# Patient Record
Sex: Female | Born: 1970
Health system: Southern US, Community
[De-identification: ages and names within clinical notes are randomized; demographics above are authoritative.]

## PROBLEM LIST (undated history)

## (undated) DIAGNOSIS — N920 Excessive and frequent menstruation with regular cycle: Secondary | ICD-10-CM

## (undated) DIAGNOSIS — K219 Gastro-esophageal reflux disease without esophagitis: Secondary | ICD-10-CM

## (undated) DIAGNOSIS — I471 Supraventricular tachycardia, unspecified: Secondary | ICD-10-CM

## (undated) DIAGNOSIS — D509 Iron deficiency anemia, unspecified: Secondary | ICD-10-CM

## (undated) DIAGNOSIS — Z973 Presence of spectacles and contact lenses: Secondary | ICD-10-CM

## (undated) DIAGNOSIS — D259 Leiomyoma of uterus, unspecified: Secondary | ICD-10-CM

## (undated) DIAGNOSIS — N946 Dysmenorrhea, unspecified: Secondary | ICD-10-CM

## (undated) DIAGNOSIS — I82409 Acute embolism and thrombosis of unspecified deep veins of unspecified lower extremity: Secondary | ICD-10-CM

## (undated) DIAGNOSIS — G4700 Insomnia, unspecified: Secondary | ICD-10-CM

## (undated) DIAGNOSIS — N6019 Diffuse cystic mastopathy of unspecified breast: Secondary | ICD-10-CM

## (undated) DIAGNOSIS — N8003 Adenomyosis of the uterus: Secondary | ICD-10-CM

## (undated) DIAGNOSIS — E039 Hypothyroidism, unspecified: Secondary | ICD-10-CM

## (undated) DIAGNOSIS — I10 Essential (primary) hypertension: Secondary | ICD-10-CM

## (undated) DIAGNOSIS — K8020 Calculus of gallbladder without cholecystitis without obstruction: Secondary | ICD-10-CM

## (undated) DIAGNOSIS — G4733 Obstructive sleep apnea (adult) (pediatric): Secondary | ICD-10-CM

## (undated) HISTORY — DX: Essential (primary) hypertension: I10

## (undated) HISTORY — PX: DILATION AND CURETTAGE OF UTERUS: SHX78

## (undated) HISTORY — DX: Obstructive sleep apnea (adult) (pediatric): G47.33

## (undated) HISTORY — DX: Hypothyroidism, unspecified: E03.9

## (undated) HISTORY — DX: Acute embolism and thrombosis of unspecified deep veins of unspecified lower extremity: I82.409

---
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
## 1997-06-08 ENCOUNTER — Other Ambulatory Visit: Admission: RE | Admit: 1997-06-08 | Discharge: 1997-06-08 | Payer: Self-pay | Admitting: Obstetrics & Gynecology

## 1997-06-23 ENCOUNTER — Ambulatory Visit (HOSPITAL_COMMUNITY): Admission: RE | Admit: 1997-06-23 | Discharge: 1997-06-23 | Payer: Self-pay | Admitting: *Deleted

## 1998-03-21 ENCOUNTER — Ambulatory Visit (HOSPITAL_COMMUNITY): Admission: RE | Admit: 1998-03-21 | Discharge: 1998-03-21 | Payer: Self-pay | Admitting: *Deleted

## 1998-03-23 ENCOUNTER — Ambulatory Visit (HOSPITAL_COMMUNITY): Admission: RE | Admit: 1998-03-23 | Discharge: 1998-03-23 | Payer: Self-pay | Admitting: Obstetrics and Gynecology

## 1998-04-19 ENCOUNTER — Ambulatory Visit (HOSPITAL_COMMUNITY): Admission: AD | Admit: 1998-04-19 | Discharge: 1998-04-19 | Payer: Self-pay | Admitting: Obstetrics & Gynecology

## 1998-04-25 ENCOUNTER — Other Ambulatory Visit: Admission: RE | Admit: 1998-04-25 | Discharge: 1998-04-25 | Payer: Self-pay | Admitting: Obstetrics & Gynecology

## 1998-07-17 ENCOUNTER — Other Ambulatory Visit: Admission: RE | Admit: 1998-07-17 | Discharge: 1998-07-17 | Payer: Self-pay | Admitting: Obstetrics & Gynecology

## 1999-02-12 ENCOUNTER — Observation Stay (HOSPITAL_COMMUNITY): Admission: AD | Admit: 1999-02-12 | Discharge: 1999-02-13 | Payer: Self-pay | Admitting: Obstetrics & Gynecology

## 1999-03-12 ENCOUNTER — Inpatient Hospital Stay (HOSPITAL_COMMUNITY): Admission: AD | Admit: 1999-03-12 | Discharge: 1999-03-12 | Payer: Self-pay | Admitting: Obstetrics & Gynecology

## 1999-03-29 ENCOUNTER — Inpatient Hospital Stay (HOSPITAL_COMMUNITY): Admission: AD | Admit: 1999-03-29 | Discharge: 1999-04-02 | Payer: Self-pay | Admitting: Obstetrics & Gynecology

## 1999-05-07 ENCOUNTER — Other Ambulatory Visit: Admission: RE | Admit: 1999-05-07 | Discharge: 1999-05-07 | Payer: Self-pay | Admitting: Obstetrics & Gynecology

## 2000-07-07 ENCOUNTER — Other Ambulatory Visit: Admission: RE | Admit: 2000-07-07 | Discharge: 2000-07-07 | Payer: Self-pay | Admitting: Obstetrics & Gynecology

## 2001-01-17 ENCOUNTER — Inpatient Hospital Stay (HOSPITAL_COMMUNITY): Admission: AD | Admit: 2001-01-17 | Discharge: 2001-01-17 | Payer: Self-pay | Admitting: Obstetrics & Gynecology

## 2001-01-18 ENCOUNTER — Inpatient Hospital Stay (HOSPITAL_COMMUNITY): Admission: AD | Admit: 2001-01-18 | Discharge: 2001-01-21 | Payer: Self-pay | Admitting: Obstetrics & Gynecology

## 2001-01-29 ENCOUNTER — Encounter: Admission: RE | Admit: 2001-01-29 | Discharge: 2001-02-28 | Payer: Self-pay | Admitting: Obstetrics & Gynecology

## 2001-02-18 ENCOUNTER — Other Ambulatory Visit: Admission: RE | Admit: 2001-02-18 | Discharge: 2001-02-18 | Payer: Self-pay | Admitting: Obstetrics & Gynecology

## 2002-03-15 ENCOUNTER — Other Ambulatory Visit: Admission: RE | Admit: 2002-03-15 | Discharge: 2002-03-15 | Payer: Self-pay | Admitting: Obstetrics & Gynecology

## 2002-12-06 ENCOUNTER — Other Ambulatory Visit: Admission: RE | Admit: 2002-12-06 | Discharge: 2002-12-06 | Payer: Self-pay | Admitting: Obstetrics & Gynecology

## 2002-12-15 ENCOUNTER — Inpatient Hospital Stay (HOSPITAL_COMMUNITY): Admission: AD | Admit: 2002-12-15 | Discharge: 2002-12-15 | Payer: Self-pay | Admitting: Obstetrics and Gynecology

## 2002-12-15 ENCOUNTER — Encounter: Payer: Self-pay | Admitting: Obstetrics and Gynecology

## 2003-01-17 ENCOUNTER — Inpatient Hospital Stay (HOSPITAL_COMMUNITY): Admission: AD | Admit: 2003-01-17 | Discharge: 2003-01-17 | Payer: Self-pay | Admitting: Obstetrics & Gynecology

## 2003-07-13 ENCOUNTER — Inpatient Hospital Stay (HOSPITAL_COMMUNITY): Admission: RE | Admit: 2003-07-13 | Discharge: 2003-07-16 | Payer: Self-pay | Admitting: Obstetrics & Gynecology

## 2003-07-13 ENCOUNTER — Encounter (INDEPENDENT_AMBULATORY_CARE_PROVIDER_SITE_OTHER): Payer: Self-pay | Admitting: *Deleted

## 2003-07-19 ENCOUNTER — Encounter: Admission: RE | Admit: 2003-07-19 | Discharge: 2003-08-18 | Payer: Self-pay | Admitting: Obstetrics & Gynecology

## 2003-08-24 ENCOUNTER — Other Ambulatory Visit: Admission: RE | Admit: 2003-08-24 | Discharge: 2003-08-24 | Payer: Self-pay | Admitting: Obstetrics & Gynecology

## 2004-10-18 ENCOUNTER — Other Ambulatory Visit: Admission: RE | Admit: 2004-10-18 | Discharge: 2004-10-18 | Payer: Self-pay | Admitting: Obstetrics & Gynecology

## 2006-12-22 ENCOUNTER — Encounter: Admission: RE | Admit: 2006-12-22 | Discharge: 2006-12-22 | Payer: Self-pay | Admitting: Obstetrics & Gynecology

## 2009-02-16 ENCOUNTER — Encounter: Admission: RE | Admit: 2009-02-16 | Discharge: 2009-02-16 | Payer: Self-pay | Admitting: Obstetrics & Gynecology

## 2010-02-26 ENCOUNTER — Inpatient Hospital Stay (HOSPITAL_COMMUNITY)
Admission: AD | Admit: 2010-02-26 | Discharge: 2010-02-26 | Payer: Self-pay | Source: Home / Self Care | Attending: Obstetrics and Gynecology | Admitting: Obstetrics and Gynecology

## 2010-05-21 LAB — URINALYSIS, ROUTINE W REFLEX MICROSCOPIC
Bilirubin Urine: NEGATIVE
Glucose, UA: NEGATIVE mg/dL
Ketones, ur: 15 mg/dL — AB
pH: 5.5 (ref 5.0–8.0)

## 2010-05-21 LAB — DIFFERENTIAL
Basophils Absolute: 0 10*3/uL (ref 0.0–0.1)
Eosinophils Relative: 0 % (ref 0–5)
Lymphocytes Relative: 15 % (ref 12–46)
Monocytes Absolute: 0.8 10*3/uL (ref 0.1–1.0)
Monocytes Relative: 7 % (ref 3–12)
Neutro Abs: 9.4 10*3/uL — ABNORMAL HIGH (ref 1.7–7.7)

## 2010-05-21 LAB — COMPREHENSIVE METABOLIC PANEL
ALT: 9 U/L (ref 0–35)
AST: 16 U/L (ref 0–37)
Albumin: 3.9 g/dL (ref 3.5–5.2)
CO2: 27 mEq/L (ref 19–32)
Calcium: 9.3 mg/dL (ref 8.4–10.5)
Chloride: 100 mEq/L (ref 96–112)
GFR calc non Af Amer: >60 mL/min (ref >60)
Sodium: 136 mEq/L (ref 135–145)
Total Bilirubin: 1.8 mg/dL — ABNORMAL HIGH (ref 0.3–1.2)

## 2010-05-21 LAB — CBC
Platelets: 192 10*3/uL (ref 150–400)
RBC: 4.32 MIL/uL (ref 3.87–5.11)
RDW: 12.8 % (ref 11.5–15.5)
WBC: 12.1 10*3/uL — ABNORMAL HIGH (ref 4.0–10.5)

## 2010-05-21 LAB — URINE MICROSCOPIC-ADD ON

## 2010-07-27 NOTE — Op Note (Signed)
[facility]  Patient:    Katie Coffey, Katie Coffey Visit Number: [id number] MRN: [id number]          Service Type: OBS Location: [facility] Attending Physician:  Minette Headland Dictated by:   Freddy Finner, M.D. Proc. Date: 01/18/01 Admit Date:  01/17/2001 Discharge Date: 01/17/2001                             Operative Report  PREOPERATIVE DIAGNOSIS:       Intrauterine pregnancy at [redacted] weeks gestation, scheduled repeat cesarean section on January 22, 2001, with early contractions.  POSTOPERATIVE DIAGNOSIS:      Intrauterine pregnancy at [redacted] weeks gestation, scheduled repeat cesarean section on January 22, 2001, with early contractions.  OPERATION/PROCEDURE:          Repeat low transverse cesarean section.  SURGEON:                      Freddy Finner, M.D.  ESTIMATED BLOOD LOSS:         Intraoperative estimated blood loss 600-800 cc.  INTRAOPERATIVE COMPLICATIONS: None, with delivery of viable female infant with Apgars of 9 and 9, cord pH 7.31.  INDICATIONS FOR PROCEDURE:    Details of the present illness are recorded in the admission note.  The patient presented in labor with lower abdominal pain and was taken to the operating room.  DESCRIPTION OF PROCEDURE:     She was placed under adequate spinal anesthesia and placed in the dorsal recumbent position with elevation of the right hip by 15 degrees.  The abdomen was prepped and draped in the usual fashion.  A Foley catheter was placed using sterile technique.  Sterile drapes were applied.  A lower abdominal transverse incision was made and old scar excised.  The incision was carried sharply down to fascia, which was entered sharply and extended to the extent of the skin incision.  The rectus sheath was developed superiorly and inferiorly with blunt and sharp dissection.  Rectus muscles were divided in the midline and the peritoneum entered sharply.  A bladder blade was placed and transverse incision  was made in the visceral peritoneum overlying the lower uterine segment.  The lower segment was entered sharply and extended in a transverse direction bluntly.  Fluid was clear.  Vacuum extractor was used to easily deliver a viable female, with Apgars of 9 as above. Blood was obtained for arterial gases and for routine.  The placenta was removed and this was confirmed manually.  The uterine incision was closed in a single layer with running locking 0 Monocryl.  Tubes and ovaries were inspected and found to be normal.  The uterus was normal.  With all packs, needles, and instruments accounted for the abdominal incision was closed in layers.  Running 0 Monocryl was used to close the peritoneum and reapproximate the rectus muscles.  Fascia was closed with running 0 PDS.  The skin was closed with broad skin staples and supported with Steri-Strips.  The patient tolerated the procedure well and was taken to the recovery room in good condition. Dictated by:   Freddy Finner, M.D. Attending Physician:  Minette Headland DD:  01/19/01 TD:  01/19/01 Job: [id number]

## 2010-07-27 NOTE — H&P (Signed)
NAMEJayci, Ellefson Ralyn Q                            ACCOUNT NO.:  [id number]   MEDICAL RECORD NO.:  [id number]                   PATIENT TYPE:  INP   LOCATION:  NA                                   FACILITY:  [facility]   PHYSICIAN:  Freddy Finner, M.D.                DATE OF BIRTH:  June 03, 1970   DATE OF ADMISSION:  DATE OF DISCHARGE:                                HISTORY & PHYSICAL   ANTICIPATED DATE OF ADMISSION:  Jul 14, 2003.   ADMITTING DIAGNOSES:  1. Intrauterine pregnancy at term.  2. Multiparity.  3. Request for surgical sterilization.  4. Request for repeat cesarean delivery.   HISTORY OF PRESENT ILLNESS:  The patient is a 40 year old white married  female, gravida 6, para 2, who is now admitted at term with her third viable  pregnancy.  Her prenatal course has been uncomplicated.  She has requested  definitive surgical sterilization concurrently with her delivery.  She has  refused vaginal birth because of two previous cesarean deliveries.   REVIEW OF SYSTEMS:  The patient's current review of systems is negative.   PRENATAL COURSE:  The patient's prenatal course has been uncomplicated.   PAST MEDICAL HISTORY:  The past medical history is recorded in detail in the  prenatal summary and will not be repeated at this time.   FAMILY HISTORY:  Family history is similarly so as in past medical history.   PHYSICAL EXAMINATION:  HEENT:  The HEENT in grossly within normal limits.  VITAL SIGNS: Blood pressure in the office was 102/66.  NECK:  Thyroid gland is not palpably enlarged.  CHEST:  Chest is clear to auscultation.  HEART:  Normal sinus rhythm without murmurs, rubs or gallops.  ABDOMEN:  Abdomen is gravid.  Estimated fetal weight is 7.5 pounds.  Estimated gestational age is 61 weeks.  EXTREMITIES:  Plus one edema without cyanosis or clubbing.  PELVIC:  Pelvic exam is deferred.   ASSESSMENT:  1. Intrauterine pregnancy, [redacted] weeks gestation.  2. Multiparity.  3. Request for  surgical delivery and surgical sterilization.                                              Freddy Finner, M.D.   WRN/MEDQ  D:  07/12/2003  T:  07/12/2003  Job:  [id number]

## 2010-07-27 NOTE — Discharge Summary (Signed)
[facility]  PatientCHIZUKO Coffey                            MRN: [id number] Adm. Date:  [id number] Disc. Date: [id number] Attending:  Minette Headland Dictator:   Danie Chandler, R.N.                           Discharge Summary  ADMISSION DIAGNOSES:          1. Intrauterine pregnancy at term.                               2. Breech presentation.  DISCHARGE DIAGNOSES:          1. Intrauterine pregnancy at term.                               2. Breech presentation.  PROCEDURES:                   March 30, 1999, primary low transverse cervical  cesarean section.  HISTORY OF PRESENT ILLNESS:   The patient is a 40 year old gravida 3, para 0, who was admitted for two-stage induction on March 29, 1999.  The patient received  Cytotec during the night, and was started on Pitocin.  She had spontaneous rupture of membranes approximately 7:30 a.m. on March 30, 1999.  On examination it was felt that she was dilated to approximately 3 cm and completely effaced, but the  baby was thought to be in a breech presentation.  Ultrasound at the bedside confirmed breech presentation.  HOSPITAL COURSE:              The patient is taken to the operating room and undergoes the above-named procedure without complications.  This is productive f a viable female infant with Apgars of 8 at one minute and 9 at five minutes. Postoperatively, the patient does well.  On postoperative day #1, the patients hemoglobin was 8.1, hematocrit 25.3, and white blood cell count 7.2.  On postoperative day #2, the patient was without complaint.  Her hemoglobin was stable at 8.5.  She had a good return of bowel function and was tolerating a regular diet.  She was also ambulating well, without difficulty, and had good pain control. he patient was discharged home on postoperative day 3.  CONDITION ON DISCHARGE:       Good.  DIET:                         Regular as  tolerated.  ACTIVITY:                     No heavy lifting, no driving, no vaginal entry.  FOLLOW-UP:                    She is to follow up in the office in one to two weeks for incision check.  DISCHARGE INSTRUCTIONS:       She is to call for temperature greater than 100 degrees, persistent nausea or vomiting, heavy vaginal bleeding, and/or redness r drainage from the incision site.  DISCHARGE MEDICATIONS:        1. Prenatal vitamin 1 p.o. q.d.  2. Niferex 150 mg 1 p.o. q.d. #30.                               3. Tylox #20 as directed by M.D. DD:  04/02/99 TD:  04/04/99 Job: [id number]

## 2010-07-27 NOTE — Op Note (Signed)
Katie, [name]                            ACCOUNT NO.:  [id number]   MEDICAL RECORD NO.:  [id number]                   PATIENT TYPE:  INP   LOCATION:  9134                                 FACILITY:  [facility]   PHYSICIAN:  Freddy Finner, M.D.                DATE OF BIRTH:  1970-10-01   DATE OF PROCEDURE:  07/13/2003  DATE OF DISCHARGE:                                 OPERATIVE REPORT   PREOPERATIVE DIAGNOSES:  1. Intrauterine pregnancy at term.  2. Surgically scarred uterus by two previous cesarean deliveries.  3. Multiparity and request for surgical sterilization.   POSTOPERATIVE DIAGNOSES:  1. Intrauterine pregnancy at term.  2. Surgically scarred uterus by two previous cesarean deliveries.  3. Multiparity and request for surgical sterilization.   OPERATIVE PROCEDURES:  1. Repeat low transverse cervical cesarean section.  2. Delivery of viable female infant.  3. Bilateral tubal ligation.   INFANT STATISTICS:  Weight 6 pounds 3 ounces.  The cord pH on an arterial  sample was 7.31.  Apgars of 9 and 9.   SURGEON:  Freddy Finner, M.D.   ASSISTANT:  Guy Sandifer. Coffey Katie, M.D.   ESTIMATED INTRAOPERATIVE BLOOD LOSS:  800 mL.   ANESTHESIA:  Spinal.   DESCRIPTION OF PROCEDURE:  Details of the present illness are recorded in  the admission note.  The patient was admitted on the morning of surgery,  brought to the operating room, and there placed under adequate spinal  anesthesia and placed in the dorsal recumbent position with elevation of the  right hip by 15 degrees.  The abdomen was prepped and draped in the usual  fashion.  A Foley catheter was inserted using sterile technique.  Sterile  drapes were applied.  The lower abdominal transverse incision was made  through an old scar and carried sharply down to fascia, which was entered  sharply and extended to the extent of the skin incision.  The rectus sheath  was developed superiorly and inferiorly with blunt and sharp  dissection.  The rectus muscles were divided in the midline.  The peritoneum was entered  and processed.  The peritoneal incision was extended bluntly to the extent  of the skin incision.  The bladder blade was placed.  The uterus was very  thin just above the bladder reflection and for that reason, bladder flap was  not developed and an incision was made directly through the lower uterine  segment.  The thickness of tissue at this level was approximately 2-3 mm.  Amniotic fluid was clear.  The incision was extended bluntly in a transverse  direction.  A viable female was then delivered without significant  difficulty.  There was a single length of nuchal cord that was reduced.  Apgars, etc., were noted above.  The placenta and other products of  conception were removed from the uterus after collection of cord blood  samples.  The uterus was delivered through the abdominal incision.  The  tubes and ovaries were normal as was the uterus.  The uterine incision was  closed with a running locking 0 Monocryl suture.  This produced complete  hemostasis.  The mid portion of each tube was elevated, doubly ligated with  0 plain ties, a segment of tube excised, and the distal mucosa ends of the  tubes were fulgurated with the Bovie.  The uterus was placed back within the  abdominal cavity.  Irrigation was carried out.  Hemostasis was complete.  The abdominal incision was then closed in layers.  Running 0 Monocryl was  used to close the peritoneum and reapproximate the rectus muscles.  The  fascia was closed with running 0 PDS.  The subcutaneous was closed with  running 2-0 plain suture.  The skin was closed with wide skin staples and  1/4 inch Steri-Strips.  The patient tolerated the operative procedure well  and was taken to recovery in good condition.                                               Freddy Finner, M.D.    WRN/MEDQ  D:  07/14/2003  T:  07/14/2003  Job:  [id number]

## 2010-07-27 NOTE — Discharge Summary (Signed)
[facility]  Patient:    Katie Coffey, Katie Coffey Visit Number: [id number] MRN: [id number]          Service Type: OBS Location: *N Attending Physician:  Minette Headland Dictated by:   Danie Chandler, R.N. Adm. Date:  01/18/01 Disc. Date: 01/21/01                             Discharge Summary  ADMISSION DIAGNOSES:          1. Intrauterine pregnancy at [redacted] weeks gestation                                  with scheduled repeat cesarean section on                                  January 22, 2001.                               2. Early contractions.  DISCHARGE DIAGNOSES:          1. Intrauterine pregnancy at [redacted] weeks gestation                                  with scheduled repeat cesarean section on                                  January 22, 2001.                               2. Early contractions.  PROCEDURE:                    On January 18, 2001, repeat low transverse cesarean section.  REASON FOR ADMISSION:         Please see H&P.  HOSPITAL COURSE:              The patient was taken to the operating room and underwent the above named procedure without complications.  This was productive of a viable female infant with Apgars of 9 at one minute and 9 at five minutes and arterial cord pH of 7.31.  Postoperatively, on day #1, the patient had a good return of bowel function and good pain control.  Her hemoglobin was 8.8, hematocrit 26.4, and white blood cell count 7.2.  She had a repeat CBC ordered for the following day.  On postoperative day #2, the patient was tolerating a regular diet and ambulating well without difficulty. Her hemoglobin was 8.4 and she was started on iron twice a day.  On postoperative day #3, she desired discharge home.  She was in stable condition with no complaints.  CONDITION ON DISCHARGE:       Good.  DIET:                         Regular as tolerated.  ACTIVITY:                     No heavy lifting, no driving, no  vaginal  entry.  FOLLOW-UP:                    She is to follow up in the office in one to two weeks for incision check and she is to call for temperature greater than 100 degrees, persistent nausea and vomiting, heavy vaginal bleeding, and/or redness or drainage from the incision site.  DISCHARGE MEDICATIONS:        1. Prenatal vitamins one p.o. q.d.                               2. Pain medicines as directed by M.D. Dictated by:   Danie Chandler, R.N. Attending Physician:  Minette Headland DD:  01/21/01 TD:  01/21/01 Job: [id number]

## 2010-07-27 NOTE — Discharge Summary (Signed)
NAMEIly, Katie Coffey                            ACCOUNT NO.:  [id number]   MEDICAL RECORD NO.:  [id number]                   PATIENT TYPE:  INP   LOCATION:  9134                                 FACILITY:  [facility]   PHYSICIAN:  Juluis Mire, M.D.                DATE OF BIRTH:  Sep 24, 1970   DATE OF ADMISSION:  07/13/2003  DATE OF DISCHARGE:  07/16/2003                                 DISCHARGE SUMMARY   ADMITTING DIAGNOSES:  1. Intrauterine pregnancy at term.  2. Surgically-scarred uterus, desires repeat.  3. Multiparity, desires permanent sterilization.   DISCHARGE DIAGNOSES:  1. Status post low transverse cesarean section.  2. Viable female infant.  3. Permanent sterilization.   PROCEDURE:  1. Repeat low transverse cesarean section.  2. Bilateral tubal ligation.   REASON FOR ADMISSION:  Please see dictated H&P.   HOSPITAL COURSE:  The patient is a 40 year old white married female gravida  6 para 2 that presented to [facility] for a scheduled  cesarean section.  The patient had requested a repeat cesarean due to  previous history of cesarean deliveries and she refused a vaginal birth  after cesarean.  The patient also had requested definitive surgical  sterilization concurrently with her delivery.  On the morning of admission  the patient was taken to the operating room where spinal anesthesia was  administered without difficulty.  A low transverse incision was made with  the delivery of a viable female infant weighing 6 pounds 3 ounces, Apgars of  9 at one minute and 9 at five minutes.  Umbilical cord pH was 7.31.  The  patient also underwent a bilateral tubal ligation without difficulty.  She  tolerated the procedure well and was taken to the recovery room in stable  condition.  On postoperative day #1 vital signs were stable; she was  afebrile.  Abdomen was soft with good return of bowel function.  Fundus was  firm and nontender.  Abdominal dressing was noted  to be clean, dry, and  intact.  Labs revealed hemoglobin of 8.7.  On postoperative day #2 the  patient was without complaint.  Vital signs were stable.  Fundus was firm  and nontender.  Abdominal dressing had removed revealing an incision that  was clean, dry, and intact.  The patient was ambulating well and tolerating  a regular diet without complaints of nausea and vomiting.  On postoperative  day #3 vital signs were stable.  Abdomen was soft, fundus was firm, incision  was clean, dry, and intact.  Staples were removed and the patient was  discharged home.   CONDITION ON DISCHARGE:  Good.   DIET:  Regular as tolerated.   ACTIVITY:  No heavy lifting, no driving x2 weeks, no vaginal entry.   FOLLOW-UP:  The patient is to follow up in the office in 1-2 weeks for an  incision check.  She  is to call for temperature greater than 100 degrees,  persistent nausea and vomiting, heavy vaginal bleeding, and/or redness or  drainage from the incisional site.   DISCHARGE MEDICATIONS:  1. Tylox #30 one Coffey.4-6h. p.r.n.  2. Motrin 600 mg Coffey.6h.  3. Prenatal vitamins one p.o. daily.  4. Colace one p.o. daily p.r.n.  5. Synthroid 0.88 mg one p.o. daily.     Julio Sicks, N.P.                        Juluis Mire, M.D.    CC/MEDQ  D:  08/24/2003  T:  08/25/2003  Job:  [id number]

## 2010-07-27 NOTE — Op Note (Signed)
[facility]  PatientESTEFANIE Coffey                            MRN: [id number] Proc. Date: 03/30/99 Adm. Date:  [id number] Attending:  Minette Headland                           Operative Report  PREOPERATIVE DIAGNOSES:       Intrauterine pregnancy at term; breech presentation.  POSTOPERATIVE DIAGNOSES:      Intrauterine pregnancy at term; breech presentation; delivery of viable female infant, Apgars of 8/9, birth weight 7 pounds 14 ounces.  OPERATIVE PROCEDURE:          Primary low transverse cervical cesarean section.  SURGEON:                      Freddy Finner, M.D.  ASSISTANT:                    Scrub nurse.  ANESTHESIA:                   Spinal.  INTRAOPERATIVE COMPLICATIONS:  None.  INDICATIONS:                  Patient is a 40 year old gravida 3, para 0 who was admitted for a two-stage induction on the evening of the 18th.  She was Cytoteced during the night and started on intravenous Pitocin.  Spontaneous rupture of membranes apparently occurred approximately 7:30 a.m.  On examination, initially at the time of my initial visit this morning, it was felt that she was dilated to approximately 3 cm and completely effaced but what was thought to be a breech was palpable.  Ultrasound at the bedside confirmed breech presentation.  DESCRIPTION OF PROCEDURE:     Patient was brought to the operating room, placed  under spinal anesthesia, placed in the dorsal recumbent position with elevation of the right hip by 15 degrees.  Lower abdomen was prepped and draped in usual fashion.  Foley catheter was placed using sterile technique.  A lower abdominal  transverse incision was made and carried sharply down to fascia, which was entered sharply, and extended to the extent of the skin incision.  Rectus sheath was developed superiorly and inferiorly with blunt and sharp dissection.  Rectus muscles were divided in the midline and peritoneum was  entered sharply and extended bluntly to the extent of the skin incision.  Transverse incision was made in the visceroperitoneum overlying the lower uterine segment and bladder bluntly dissected off the lower segment.  A transverse incision was made in the lower uterine segment and extended bluntly in a transverse direction.  Breech extraction delivery was  accomplished without difficulty of a viable female, Apgars of 8/9.  Placenta and other parts of conception were removed from the uterus and this was confirmed complete by manual exploration and removal of residual fragments of placenta. Edges of the uterine incision were grasped with ring forceps.  Uterine incision was closed in a single layer with running-locking 0 Monocryl.  Bladder flap was reapproximated with running 0 Monocryl.  Tubes and ovaries were inspected and found to be normal.  Uterus was palpably normal.  Irrigation was carried out. Hemostasis was complete.  Appropriate pack, needle and instrument counts were obtained. Abdominal incision as then closed in layers with running 0 Monocryl  to close the peritoneum and reapproximate the rectus muscles, fascia was closed with running 0 PDS and skin was closed with wide skin staples and quarter-inch Steri-Strips. Patient tolerated the procedure well and was taken to recovery in good condition. DD:  03/30/99 TD:  03/30/99 Job: [id number]

## 2011-06-09 ENCOUNTER — Ambulatory Visit (INDEPENDENT_AMBULATORY_CARE_PROVIDER_SITE_OTHER): Payer: Self-pay | Admitting: Physician Assistant

## 2011-06-09 VITALS — BP 111/77 | HR 87 | Temp 98.5°F | Resp 16 | Ht 61.5 in | Wt 126.0 lb

## 2011-06-09 DIAGNOSIS — R059 Cough, unspecified: Secondary | ICD-10-CM

## 2011-06-09 DIAGNOSIS — E039 Hypothyroidism, unspecified: Secondary | ICD-10-CM | POA: Insufficient documentation

## 2011-06-09 DIAGNOSIS — J111 Influenza due to unidentified influenza virus with other respiratory manifestations: Secondary | ICD-10-CM

## 2011-06-09 DIAGNOSIS — R05 Cough: Secondary | ICD-10-CM

## 2011-06-09 MED ORDER — IPRATROPIUM BROMIDE 0.06 % NA SOLN: 2.0000 | Freq: Three times a day (TID) | NASAL | Status: DC

## 2011-06-09 MED ORDER — IBUPROFEN 800 MG PO TABS: 800.0000 mg | ORAL_TABLET | Freq: Three times a day (TID) | ORAL | Status: AC | PRN

## 2011-06-09 MED ORDER — OSELTAMIVIR PHOSPHATE 75 MG PO CAPS: 75.0000 mg | ORAL_CAPSULE | Freq: Two times a day (BID) | ORAL | Status: AC

## 2011-06-09 MED ORDER — FLUTICASONE PROPIONATE 50 MCG/ACT NA SUSP: 2.0000 | Freq: Every day | NASAL | Status: DC

## 2011-06-09 MED ORDER — BENZONATATE 100 MG PO CAPS: 100.0000 mg | ORAL_CAPSULE | Freq: Three times a day (TID) | ORAL | Status: AC | PRN

## 2011-06-09 MED ORDER — HYDROCODONE-HOMATROPINE 5-1.5 MG/5ML PO SYRP: ORAL_SOLUTION | ORAL | Status: AC

## 2011-06-09 NOTE — Progress Notes (Signed)
Patient ID: Katie Coffey MRN: [id number], DOB: Aug 06, 1970, 41 y.o. Date of Encounter: 06/09/2011, 9:12 AM  Primary Physician: Cala Bradford, MD, MD  Chief Complaint:  Chief Complaint  Patient presents with  . Sinus Problem    sinus congestion  . Cough    dry   . Fever    on and off x 3 days    HPI: 41 y.o. year old female presents with a 3 day history of nasal congestion, post nasal drip, sore throat, sinus pressure, and cough. Subjective fever and chills. Son had been sick with a fever prior to patient developing the above. He is now feeling better. Nasal congestion thick and green/yellow. Cough is non-productive and not associated with time of day. Ears feel full, leading to sensation of muffled hearing. Has tried OTC cold preps without success. No GI complaints. Appetite slightly decreased. Patient is leaving for a cruise this afternoon. No flu vaccine this year.  No sick contacts, recent antibiotics, or recent travels.   No leg trauma, sedentary periods, h/o cancer, or tobacco use.  Past Medical History  Diagnosis Date  . Hypothyroid      Home Meds: Prior to Admission medications   Medication Sig Start Date End Date Taking? Authorizing Provider  levothyroxine (SYNTHROID, LEVOTHROID) 75 MCG tablet Take 75 mcg by mouth daily.   Yes Historical Provider, MD  benzonatate (TESSALON PERLES) 100 MG capsule Take 1 capsule (100 mg total) by mouth 3 (three) times daily as needed for cough. 06/09/11 06/16/11  Shanzay Hepworth M Aisley Whan, PA-C  fluticasone (FLONASE) 50 MCG/ACT nasal spray Place 2 sprays into the nose daily. 06/09/11 06/08/12  Darleene Cumpian M Matha Masse, PA-C  HYDROcodone-homatropine (HYCODAN) 5-1.5 MG/5ML syrup 1 TSP PO Q 4-6 HOURS PRN COUGH 06/09/11 06/19/11  Pedro Oldenburg M Leoma Folds, PA-C  ibuprofen (ADVIL,MOTRIN) 800 MG tablet Take 1 tablet (800 mg total) by mouth every 8 (eight) hours as needed for pain. 06/09/11 06/19/11  Kelyn Koskela M Dallyn Bergland, PA-C  ipratropium (ATROVENT) 0.06 % nasal spray Place 2 sprays into the nose 3  (three) times daily. 06/09/11 06/08/12  Raymon Mutton Talonda Artist, PA-C  oseltamivir (TAMIFLU) 75 MG capsule Take 1 capsule (75 mg total) by mouth 2 (two) times daily. 06/09/11 06/19/11  Sondra Barges, PA-C    Allergies: Allergies no known allergies  History   Social History  . Marital Status: Married    Spouse Name: N/A    Number of Children: N/A  . Years of Education: N/A   Occupational History  . Not on file.   Social History Main Topics  . Smoking status: Never Smoker   . Smokeless tobacco: Not on file  . Alcohol Use: Not on file  . Drug Use: Not on file  . Sexually Active: Not on file   Other Topics Concern  . Not on file   Social History Narrative  . No narrative on file     Review of Systems: Constitutional: negative for night sweats or weight changes Cardiovascular: negative for chest pain or palpitations Respiratory: negative for hemoptysis, wheezing, or shortness of breath Abdominal: negative for abdominal pain, nausea, vomiting or diarrhea Dermatological: negative for rash Neurologic: negative for headache   Physical Exam: Blood pressure 111/77, pulse 87, temperature 98.5 F (36.9 C), temperature source Oral, resp. rate 16, height 5' 1.5" (1.562 m), weight 126 lb (57.153 kg)., Body mass index is 23.42 kg/(m^2). General: Well developed, well nourished, in no acute distress. Head: Normocephalic, atraumatic, eyes without discharge, sclera non-icteric, nares are congested.  Bilateral auditory canals clear, TM's are without perforation, pearly grey with reflective cone of light bilaterally. Serous effusion bilaterally behind TM's. Maxillary sinus TTP. Oral cavity moist, dentition normal. Posterior pharynx with post nasal drip and mild erythema. No peritonsillar abscess or tonsillar exudate. Neck: Supple. No thyromegaly. Full ROM. No lymphadenopathy. Lungs: Coarse breath sounds bilaterally without wheezes, rales, or rhonchi. Breathing is unlabored.  Heart: RRR with S1 S2. No murmurs,  rubs, or gallops appreciated. Msk:  Strength and tone normal for age. Extremities: No clubbing or cyanosis. No edema. Neuro: Alert and oriented X 3. Moves all extremities spontaneously. CNII-XII grossly in tact. Psych:  Responds to questions appropriately with a normal affect.   Labs: Results for orders placed in visit on 06/09/11  POCT INFLUENZA A/B      Component Value Range   Influenza A, POC Negative     Influenza B, POC Positive       ASSESSMENT AND PLAN:  41 y.o. year old female with influenza B -Tamiflu 75 mg #10 1 po bid no RF SED -Hycodan #4oz 1 tsp po q 4-6 hours prn cough no RF SED -Flonase 2 sprays each nare daily #1 no RF -Atrovent NS 0.06% 2 sprays each nare bid prn #1 no RF -Tessalon Perles 100 mg 1 po tid prn cough #30 no RF  -Motrin 800 mg #30 1 po tid prn no RF -Mucinex -Tylenol/Motrin prn -Rest/fluids -RTC precautions -RTC 3-5 days if no improvement  Signed, Eula Listen, PA-C 06/09/2011 9:12 AM

## 2012-03-25 ENCOUNTER — Other Ambulatory Visit: Payer: Self-pay | Admitting: Obstetrics & Gynecology

## 2012-03-25 DIAGNOSIS — R928 Other abnormal and inconclusive findings on diagnostic imaging of breast: Secondary | ICD-10-CM

## 2012-04-09 ENCOUNTER — Ambulatory Visit
Admission: RE | Admit: 2012-04-09 | Discharge: 2012-04-09 | Disposition: A | Discharge status: Home / Self Care | Payer: BC Managed Care – PPO | Source: Ambulatory Visit | Attending: Obstetrics & Gynecology | Admitting: Obstetrics & Gynecology

## 2012-04-09 DIAGNOSIS — R928 Other abnormal and inconclusive findings on diagnostic imaging of breast: Secondary | ICD-10-CM

## 2014-03-23 ENCOUNTER — Other Ambulatory Visit: Payer: Self-pay | Admitting: Obstetrics & Gynecology

## 2014-03-24 LAB — CYTOLOGY - PAP

## 2014-07-06 DIAGNOSIS — N289 Disorder of kidney and ureter, unspecified: Secondary | ICD-10-CM | POA: Insufficient documentation

## 2014-09-04 ENCOUNTER — Emergency Department (HOSPITAL_COMMUNITY)
Admission: EM | Admit: 2014-09-04 | Discharge: 2014-09-05 | Disposition: A | Discharge status: Home / Self Care | Payer: BLUE CROSS/BLUE SHIELD | Attending: Emergency Medicine | Admitting: Emergency Medicine

## 2014-09-04 ENCOUNTER — Emergency Department (HOSPITAL_COMMUNITY): Payer: BLUE CROSS/BLUE SHIELD

## 2014-09-04 ENCOUNTER — Encounter (HOSPITAL_COMMUNITY): Payer: Self-pay | Admitting: Family Medicine

## 2014-09-04 DIAGNOSIS — R1013 Epigastric pain: Secondary | ICD-10-CM

## 2014-09-04 DIAGNOSIS — R61 Generalized hyperhidrosis: Secondary | ICD-10-CM | POA: Insufficient documentation

## 2014-09-04 DIAGNOSIS — E039 Hypothyroidism, unspecified: Secondary | ICD-10-CM | POA: Diagnosis not present

## 2014-09-04 DIAGNOSIS — R079 Chest pain, unspecified: Secondary | ICD-10-CM

## 2014-09-04 DIAGNOSIS — R42 Dizziness and giddiness: Secondary | ICD-10-CM | POA: Diagnosis not present

## 2014-09-04 DIAGNOSIS — Z7951 Long term (current) use of inhaled steroids: Secondary | ICD-10-CM | POA: Insufficient documentation

## 2014-09-04 DIAGNOSIS — R109 Unspecified abdominal pain: Secondary | ICD-10-CM

## 2014-09-04 DIAGNOSIS — Z79899 Other long term (current) drug therapy: Secondary | ICD-10-CM | POA: Diagnosis not present

## 2014-09-04 DIAGNOSIS — R1110 Vomiting, unspecified: Secondary | ICD-10-CM

## 2014-09-04 DIAGNOSIS — K8020 Calculus of gallbladder without cholecystitis without obstruction: Secondary | ICD-10-CM | POA: Diagnosis not present

## 2014-09-04 MED ORDER — MORPHINE SULFATE 4 MG/ML IJ SOLN
4.0000 mg | Freq: Once | INTRAMUSCULAR | Status: AC
  Administered 2014-09-05: 4 mg via INTRAVENOUS

## 2014-09-04 MED ORDER — ASPIRIN 325 MG PO TABS
325.0000 mg | ORAL_TABLET | Freq: Once | ORAL | Status: AC
  Administered 2014-09-05: 325 mg via ORAL
  Filled 2014-09-04: qty 1

## 2014-09-04 NOTE — ED Provider Notes (Signed)
CSN: [id number]     Arrival date & time 09/04/14  2254 History   First MD Initiated Contact with Patient 09/04/14 2321     Chief Complaint  Patient presents with  . Chest Pain  . Emesis     (Consider location/radiation/quality/duration/timing/severity/associated sxs/prior Treatment) HPI Pt is a 44yo female, presenting to ED with c/o sudden onset, gradually worsening centralized chest pain and upper abdominal pain that started about 1 hour PTA.  Pt states she was lying in bed when symptoms started. Pt thought she was having heartburn so she took Zantac but no relief.  Pt reports similar pain several times over the last 2 months but never this severe.  Nothing seems to make pain better or worse.  Pt c/o nausea, has vomited 2-3 times since pain started.  Pt also c/o feeling lightheaded.  Per triage note, pt diaphoretic.  Pt denies hx of CAD. Denies FH of CAD. Denies hx of HTN or high cholesterol.  Hx of hypothyroidism, otherwise no other significant PMH.  Pt does question if symptoms are due to her gallbladder as friends with gallstones have had similar symptoms.  Denies urinary or vaginal symptoms. Abdominal surgical hx significant for c-section and ovarian cyst surgery.   Past Medical History  Diagnosis Date  . Hypothyroid    Past Surgical History  Procedure Laterality Date  . Cesarean section    . Ovarian cyst surgery     History reviewed. No pertinent family history. History  Substance Use Topics  . Smoking status: Never Smoker   . Smokeless tobacco: Not on file  . Alcohol Use: Yes     Comment: Once a month.    OB History    No data available     Review of Systems  Constitutional: Positive for diaphoresis. Negative for fever, chills, appetite change and fatigue.  Respiratory: Negative for cough and shortness of breath.   Cardiovascular: Positive for chest pain. Negative for palpitations and leg swelling.  Neurological: Positive for light-headedness. Negative for dizziness,  seizures and headaches.  All other systems reviewed and are negative.     Allergies  Other  Home Medications   Prior to Admission medications   Medication Sig Start Date End Date Taking? Authorizing Provider  levothyroxine (SYNTHROID, LEVOTHROID) 75 MCG tablet Take 75 mcg by mouth daily.   Yes Historical Provider, MD  norethindrone-ethinyl estradiol-iron (LOESTRIN FE 1.5/30) 1.5-30 MG-MCG tablet Take 1 tablet by mouth daily.   Yes Historical Provider, MD  fluticasone (FLONASE) 50 MCG/ACT nasal spray Place 2 sprays into the nose daily. Patient not taking: Reported on 09/05/2014 06/09/11 06/08/12  Areta Haber Dunn, PA-C  ipratropium (ATROVENT) 0.06 % nasal spray Place 2 sprays into the nose 3 (three) times daily. Patient not taking: Reported on 09/05/2014 06/09/11 06/08/12  Areta Haber Dunn, PA-C   BP 109/76 mmHg  Pulse 66  Temp(Src) 97.5 F (36.4 C) (Oral)  Resp 16  Ht 5\' 1"  (1.549 m)  Wt 125 lb (56.7 kg)  BMI 23.63 kg/m2  SpO2 100% Physical Exam  Constitutional: She appears well-developed and well-nourished. She appears distressed.  Pt lying in exam bed, emesis bag at her side. Appears uncomfortable.  HENT:  Head: Normocephalic and atraumatic.  Eyes: Conjunctivae are normal. No scleral icterus.  Neck: Normal range of motion.  Cardiovascular: Normal rate, regular rhythm and normal heart sounds.   Pulmonary/Chest: Effort normal and breath sounds normal. No respiratory distress. She has no wheezes. She has no rales. She exhibits no tenderness.  Abdominal:  Soft. Bowel sounds are normal. She exhibits no distension and no mass. There is tenderness. There is no rebound and no guarding.  Soft, non-distended, upper abdominal tenderness w/o rebound or guarding.no CVAT  Musculoskeletal: Normal range of motion.  Neurological: She is alert.  Skin: Skin is warm. She is diaphoretic.  Nursing note and vitals reviewed.   ED Course  Procedures (including critical care time) Labs Review Labs Reviewed   CBC - Abnormal; Notable for the following:    WBC 12.1 (*)    All other components within normal limits  COMPREHENSIVE METABOLIC PANEL - Abnormal; Notable for the following:    AST 53 (*)    All other components within normal limits  I-STAT TROPOININ, ED    Imaging Review Dg Chest Port 1 View  09/05/2014   CLINICAL DATA:  Burning chest pain, radiating through to the back. Onset tonight.  EXAM: PORTABLE CHEST - 1 VIEW  COMPARISON:  None.  FINDINGS: A single AP portable view of the chest demonstrates no focal airspace consolidation or alveolar edema. The lungs are grossly clear. There is no large effusion or pneumothorax. Cardiac and mediastinal contours appear unremarkable.  IMPRESSION: No active disease.   Electronically Signed   By: Andreas Newport M.D.   On: 09/05/2014 00:18     EKG Interpretation None      MDM   Final diagnoses:  None   Pt is a 44yo female c/o chest pain and upper abdominal pain, n/v with diaphoresis tonight. CP concerning for ACS but no significant risk factors.  Similar pain several other times within last 2 months. Pt concerned for her gallbladder. Cardiac workup performed as well as CMP included in labs  Aspirin and morphine given which have helped pain  Labs: negative troponin, slight elevated AST at 53. Will get U/S abdomen to r/o cholecystitis vs cholelithiasis.  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 Plan is to f/u on U/S and delta tropoin at 3AM.  If both unremarkable, pt maybe discharged home with pain and nausea medication, f/u with PCP.  If troponin elevated or U/S has significant findings, tx/consult appropriately.      Noland Fordyce, PA-C 09/05/14 0107  Varney Biles, MD 09/06/14 [phone]

## 2014-09-04 NOTE — ED Notes (Signed)
Bed: WA22 Expected date:  Expected time:  Means of arrival:  Comments: Hold for triage 

## 2014-09-04 NOTE — ED Notes (Signed)
Patient is complaining of mid chest pain/upper gastric pain that started an hour ago. Pain started off as feeling of heartburn. Pt took Zantac with no relief. This chest pain has occurred before but not as worse. Nausea, vomiting, lightheadedness, and diaphoresis present.

## 2014-09-05 ENCOUNTER — Emergency Department (HOSPITAL_COMMUNITY): Payer: BLUE CROSS/BLUE SHIELD

## 2014-09-05 LAB — COMPREHENSIVE METABOLIC PANEL
ALT: 28 U/L (ref 14–54)
AST: 53 U/L — ABNORMAL HIGH (ref 15–41)
Albumin: 4.3 g/dL (ref 3.5–5.0)
Alkaline Phosphatase: 43 U/L (ref 38–126)
Anion gap: 9 (ref 5–15)
BUN: 13 mg/dL (ref 6–20)
CO2: 27 mmol/L (ref 22–32)
Calcium: 9.2 mg/dL (ref 8.9–10.3)
Chloride: 105 mmol/L (ref 101–111)
Creatinine, Ser: 0.81 mg/dL (ref 0.44–1.00)
GFR calc Af Amer: >60 mL/min (ref >60)
GFR calc non Af Amer: >60 mL/min (ref >60)
Glucose, Bld: 91 mg/dL (ref 65–99)
Potassium: 4.1 mmol/L (ref 3.5–5.1)
Sodium: 141 mmol/L (ref 135–145)
Total Bilirubin: 0.7 mg/dL (ref 0.3–1.2)
Total Protein: 7.6 g/dL (ref 6.5–8.1)

## 2014-09-05 LAB — CBC
HEMATOCRIT: 42.8 % (ref 36.0–46.0)
HEMOGLOBIN: 14 g/dL (ref 12.0–15.0)
MCH: 31.5 pg (ref 26.0–34.0)
MCHC: 32.7 g/dL (ref 30.0–36.0)
MCV: 96.2 fL (ref 78.0–100.0)
Platelets: 240 10*3/uL (ref 150–400)
RBC: 4.45 MIL/uL (ref 3.87–5.11)
RDW: 13.1 % (ref 11.5–15.5)
WBC: 12.1 10*3/uL — AB (ref 4.0–10.5)

## 2014-09-05 LAB — I-STAT TROPONIN, ED
Troponin i, poc: 0 ng/mL (ref 0.00–0.08)
Troponin i, poc: 0.01 ng/mL (ref 0.00–0.08)

## 2014-09-05 MED ORDER — MORPHINE SULFATE 4 MG/ML IJ SOLN
4.0000 mg | Freq: Once | INTRAMUSCULAR | Status: DC
  Filled 2014-09-05: qty 1

## 2014-09-05 MED ORDER — OMEPRAZOLE 20 MG PO CPDR: 20.0000 mg | DELAYED_RELEASE_CAPSULE | Freq: Every day | ORAL | Status: DC

## 2014-09-05 MED ORDER — HYDROCODONE-ACETAMINOPHEN 5-325 MG PO TABS: 1.0000 | ORAL_TABLET | ORAL | Status: DC | PRN

## 2014-09-05 MED ORDER — ONDANSETRON HCL 4 MG PO TABS: 4.0000 mg | ORAL_TABLET | Freq: Four times a day (QID) | ORAL | Status: DC

## 2014-09-05 NOTE — Discharge Instructions (Signed)
Abdominal Pain Many things can cause abdominal pain. Usually, abdominal pain is not caused by a disease and will improve without treatment. It can often be observed and treated at home. Your health care provider will do a physical exam and possibly order blood tests and X-rays to help determine the seriousness of your pain. However, in many cases, more time must pass before a clear cause of the pain can be found. Before that point, your health care provider may not know if you need more testing or further treatment. HOME CARE INSTRUCTIONS  Monitor your abdominal pain for any changes. The following actions may help to alleviate any discomfort you are experiencing:  Only take over-the-counter or prescription medicines as directed by your health care provider.  Do not take laxatives unless directed to do so by your health care provider.  Try a clear liquid diet (broth, tea, or water) as directed by your health care provider. Slowly move to a bland diet as tolerated. SEEK MEDICAL CARE IF:  You have unexplained abdominal pain.  You have abdominal pain associated with nausea or diarrhea.  You have pain when you urinate or have a bowel movement.  You experience abdominal pain that wakes you in the night.  You have abdominal pain that is worsened or improved by eating food.  You have abdominal pain that is worsened with eating fatty foods.  You have a fever. SEEK IMMEDIATE MEDICAL CARE IF:   Your pain does not go away within 2 hours.  You keep throwing up (vomiting).  Your pain is felt only in portions of the abdomen, such as the right side or the left lower portion of the abdomen.  You pass bloody or black tarry stools. MAKE SURE YOU:  Understand these instructions.   Will watch your condition.   Will get help right away if you are not doing well or get worse.  Document Released: 12/05/2004 Document Revised: 03/02/2013 Document Reviewed: 11/04/2012 [facility] Patient Information  2015 [place], [place]. This information is not intended to replace advice given to you by your health care provider. Make sure you discuss any questions you have with your health care provider. Cholelithiasis Cholelithiasis (also called gallstones) is a form of gallbladder disease in which gallstones form in your gallbladder. The gallbladder is an organ that stores bile made in the liver, which helps digest fats. Gallstones begin as small crystals and slowly grow into stones. Gallstone pain occurs when the gallbladder spasms and a gallstone is blocking the duct. Pain can also occur when a stone passes out of the duct.  RISK FACTORS  Being female.   Having multiple pregnancies. Health care providers sometimes advise removing diseased gallbladders before future pregnancies.   Being obese.  Eating a diet heavy in fried foods and fat.   Being older than 75 years and increasing age.   Prolonged use of medicines containing female hormones.   Having diabetes mellitus.   Rapidly losing weight.   Having a family history of gallstones (heredity).  SYMPTOMS  Nausea.   Vomiting.  Abdominal pain.   Yellowing of the skin (jaundice).   Sudden pain. It may persist from several minutes to several hours.  Fever.   Tenderness to the touch. In some cases, when gallstones do not move into the bile duct, people have no pain or symptoms. These are called "silent" gallstones.  TREATMENT Silent gallstones do not need treatment. In severe cases, emergency surgery may be required. Options for treatment include:  Surgery to remove the  gallbladder. This is the most common treatment.  Medicines. These do not always work and may take 6-12 months or more to work.  Shock wave treatment (extracorporeal biliary lithotripsy). In this treatment an ultrasound machine sends shock waves to the gallbladder to break gallstones into smaller pieces that can pass into the intestines or be dissolved by  medicine. HOME CARE INSTRUCTIONS   Only take over-the-counter or prescription medicines for pain, discomfort, or fever as directed by your health care provider.   Follow a low-fat diet until seen again by your health care provider. Fat causes the gallbladder to contract, which can result in pain.   Follow up with your health care provider as directed. Attacks are almost always recurrent and surgery is usually required for permanent treatment.  SEEK IMMEDIATE MEDICAL CARE IF:   Your pain increases and is not controlled by medicines.   You have a fever or persistent symptoms for more than 2-3 days.   You have a fever and your symptoms suddenly get worse.   You have persistent nausea and vomiting.  MAKE SURE YOU:   Understand these instructions.  Will watch your condition.  Will get help right away if you are not doing well or get worse. Document Released: 02/21/2005 Document Revised: 10/28/2012 Document Reviewed: 08/19/2012 [facility] Patient Information 2015 [place], [place]. This information is not intended to replace advice given to you by your health care provider. Make sure you discuss any questions you have with your health care provider.

## 2014-09-05 NOTE — ED Provider Notes (Signed)
Acid reflux today after lying down Epigastric pain, nausea/vomiting, no fever - similar to x 2 months symptoms ?Gall bladder - sxs not associated with eating  Pending delta troponin Pending [place] for gall stones Pain controlled  Re-evalaution: She is feeling better, no pain currently. Re-examination of the abdomen finds no tenderness, specifically, no RUQ tenderness. Discussed US findings of gall stones and elective surgery for cholecystectomy. She has a scheduled appointment with GI in 3 days.  Prilosec recommended.   Charlann Lange, PA-C 09/05/14 0630

## 2014-09-06 ENCOUNTER — Ambulatory Visit: Payer: Self-pay | Admitting: Surgery

## 2014-09-06 NOTE — H&P (Signed)
Katie Coffey 09/06/2014 9:40 AM Location: [facility] Patient #: [id number] DOB: 06-Oct-1970 Married / Language: Undefined / Race: Undefined Female History of Present Illness (Torria Fromer A. Hayla Hinger MD; 09/06/2014 11:03 AM) Patient words: NP, GB Pt presents with hx of RUQ pain after eating for aa number of months. This worsened sunday and she was seen in the [facility] ER. Pt had UQ apin/CP for 4 hours and it would not go away. Has a hx of this type of pain. U/S showed galstones and CBD 5.6 mm . LFT normal except mild elevation of AST. She is currently asymptomatic. Watches her fatt intake.     CLINICAL DATA: Right upper quadrant pain with nausea and vomiting for 4 hr. Chest and abdominal pain. EXAM: US ABDOMEN LIMITED - RIGHT UPPER QUADRANT COMPARISON: None. FINDINGS: Gallbladder: Physiologically distended containing multiple mobile gallstones. Mild gallbladder wall thickening of 3.6 mm. No sonographic Murphy sign noted. Common bile duct: Diameter: 5.3 mm, normal. Liver: Simple cyst noted in the right and left hepatic lobe, largest in the right lobe measures 2.1 x 2.3 x 2.0 cm no solid lesions. Otherwise within normal limits in parenchymal echogenicity. IMPRESSION: 1. Cholelithiasis with borderline mild gallbladder wall thickening 3.6 mm, however negative sonographic Murphy sign. Nuclear medicine hepatobiliary scan could be considered for evaluation of acute cholecystitis based on clinical concern. 2. Hepatic cysts. Electronically Signed By: Jeb Levering M.D. On: 09/05/2014 02:49    Interpretation Summary CLINICAL DATA: Right upper quadrant pain with nausea and vomiting for 4 hr. Chest and abdominal pain. EXAM: US ABDOMEN LIMITED - RIGHT UPPER QUADRANT COMPARISON: None. FINDINGS: Gallbladder: Physiologically distended containing multiple mobile gallstones. Mild gallbladder wall thickening of 3.6 mm. No sonographic Murphy sign noted. Common bile duct:  Diameter: 5.3 mm, normal. Liver: Simple cyst noted in the right and left hepatic lobe, largest in the right lobe measures 2.1 x 2.3 x 2.0 cm no solid lesions. Otherwise within normal limits in parenchymal echogenicity. IMPRESSION: 1. Cholelithiasis with borderline mild gallbladder wall thickening 3.6 mm, however negative sonographic Murphy sign. Nuclear medicine hepatobiliary scan could be considered for evaluation of acute cholecystitis based on clinical concern. 2. Hepatic cysts. Electronically Signed By: Jeb Levering M.D. On: 09/05/2014 02:49.  The patient is a 44 year old female   Other Problems Yehuda Mao, RMA; 09/06/2014 9:40 AM) Back Pain Chest pain Cholelithiasis Hemorrhoids Thyroid Disease  Past Surgical History Yehuda Mao, RMA; 09/06/2014 9:40 AM) Cesarean Section - Multiple  Diagnostic Studies History Yehuda Mao, RMA; 09/06/2014 9:40 AM) Colonoscopy within last year Mammogram within last year Pap Smear 1-5 years ago  Allergies Yehuda Mao, RMA; 09/06/2014 9:43 AM) No Known Drug Allergies 09/06/2014  Medication History Shirlean Mylar Gwynn, RMA; 09/06/2014 9:44 AM) Lo Loestrin Fe (1 MG-10 MCG /10 MCG Tablet, Oral daily) Active. Synthroid (75MCG Tablet, Oral daily) Active. Hydrocodone-Acetaminophen (5-325MG  Tablet, Oral as needed) Active. PriLOSEC (20MG  Capsule DR, Oral daily) Active. Medications Reconciled  Social History Yehuda Mao, RMA; 09/06/2014 9:40 AM) Alcohol use Occasional alcohol use. No caffeine use No drug use Tobacco use Never smoker.  Family History Yehuda Mao, [place]; 09/06/2014 9:40 AM) Arthritis Mother. Colon Cancer Family Members In General. Colon Polyps Father, Mother. Hypertension Brother. Prostate Cancer Father. Thyroid problems Mother.  Pregnancy / Birth History Yehuda Mao, RMA; 09/06/2014 9:40 AM) Age at menarche 59 years. Contraceptive History Oral contraceptives. Gravida 6 Irregular  periods Maternal age 78-30 Para 3     Review of Systems Yehuda Mao RMA; 09/06/2014 9:40 AM) General Not Present- Appetite Loss,  Chills, Fatigue, Fever, Night Sweats, Weight Gain and Weight Loss. Skin Not Present- Change in Wart/Mole, Dryness, Hives, Jaundice, New Lesions, Non-Healing Wounds, Rash and Ulcer. HEENT Present- Wears glasses/contact lenses. Not Present- Earache, Hearing Loss, Hoarseness, Nose Bleed, Oral Ulcers, Ringing in the Ears, Seasonal Allergies, Sinus Pain, Sore Throat, Visual Disturbances and Yellow Eyes. Respiratory Not Present- Bloody sputum, Chronic Cough, Difficulty Breathing, Snoring and Wheezing. Breast Not Present- Breast Mass, Breast Pain, Nipple Discharge and Skin Changes. Cardiovascular Not Present- Chest Pain, Difficulty Breathing Lying Down, Leg Cramps, Palpitations, Rapid Heart Rate, Shortness of Breath and Swelling of Extremities. Gastrointestinal Not Present- Abdominal Pain, Bloating, Bloody Stool, Change in Bowel Habits, Chronic diarrhea, Constipation, Difficulty Swallowing, Excessive gas, Gets full quickly at meals, Hemorrhoids, Indigestion, Nausea, Rectal Pain and Vomiting. Female Genitourinary Not Present- Frequency, Nocturia, Painful Urination, Pelvic Pain and Urgency. Musculoskeletal Present- Back Pain. Not Present- Joint Pain, Joint Stiffness, Muscle Pain, Muscle Weakness and Swelling of Extremities. Neurological Not Present- Decreased Memory, Fainting, Headaches, Numbness, Seizures, Tingling, Tremor, Trouble walking and Weakness. Psychiatric Not Present- Anxiety, Bipolar, Change in Sleep Pattern, Depression, Fearful and Frequent crying. Endocrine Not Present- Cold Intolerance, Excessive Hunger, Hair Changes, Heat Intolerance, Hot flashes and New Diabetes. Hematology Not Present- Easy Bruising, Excessive bleeding, Gland problems, HIV and Persistent Infections.  Vitals (Robin Gwynn RMA; 09/06/2014 9:45 AM) 09/06/2014 9:44 AM Weight: 127.4 lb Height:  61in Body Surface Area: 1.58 m Body Mass Index: 24.07 kg/m Temp.: 98.27F  Pulse: 48 (Regular)  BP: 120/80 (Sitting, Left Arm, Standard)     Physical Exam (Eva Griffo A. Adrain Nesbit MD; 09/06/2014 11:04 AM)  General Mental Status-Alert. General Appearance-Consistent with stated age. Hydration-Well hydrated. Voice-Normal.  Head and Neck Head-normocephalic, atraumatic with no lesions or palpable masses.  Eye Eyeball - Bilateral-Extraocular movements intact. Sclera/Conjunctiva - Bilateral-No scleral icterus.  Abdomen Inspection Inspection of the abdomen reveals - No Hernias. Skin - Scar - no surgical scars. Palpation/Percussion Palpation and Percussion of the abdomen reveal - Soft, Non Tender, No Rebound tenderness, No Rigidity (guarding) and No hepatosplenomegaly. Auscultation Auscultation of the abdomen reveals - Bowel sounds normal. Note: c section scar   Neurologic Neurologic evaluation reveals -alert and oriented x 3 with no impairment of recent or remote memory. Mental Status-Normal.  Musculoskeletal Normal Exam - Left-Upper Extremity Strength Normal and Lower Extremity Strength Normal. Normal Exam - Right-Upper Extremity Strength Normal, Lower Extremity Weakness.    Assessment & Plan (Prakriti Carignan A. Aleister Lady MD; 09/06/2014 10:12 AM)  CHOLELITHIASIS (574.20  K80.20) Impression: recommend laproscopic cholecystectomy with cholangiogram The procedure has been discussed with the patient. Risks of laparoscopic cholecystectomy include bleeding, infection, bile duct injury, leak, death, open surgery, diarrhea, other surgery, organ injury, blood vessel injury, DVT, and additional care.  Current Plans Pt Education - [facility] Laparosopic Post Op [facility]CI (Gross) Pt Education - [facility] Laparoscopic Surgery HCI Pt Education - Gallstones: discussed with patient and provided information.

## 2014-09-07 ENCOUNTER — Encounter: Payer: Self-pay | Admitting: Internal Medicine

## 2014-09-13 ENCOUNTER — Encounter (HOSPITAL_BASED_OUTPATIENT_CLINIC_OR_DEPARTMENT_OTHER): Payer: Self-pay | Admitting: *Deleted

## 2014-09-16 ENCOUNTER — Ambulatory Visit (HOSPITAL_COMMUNITY): Payer: BLUE CROSS/BLUE SHIELD

## 2014-09-16 ENCOUNTER — Ambulatory Visit (HOSPITAL_BASED_OUTPATIENT_CLINIC_OR_DEPARTMENT_OTHER): Payer: BLUE CROSS/BLUE SHIELD | Admitting: Anesthesiology

## 2014-09-16 ENCOUNTER — Ambulatory Visit (HOSPITAL_BASED_OUTPATIENT_CLINIC_OR_DEPARTMENT_OTHER)
Admission: RE | Admit: 2014-09-16 | Discharge: 2014-09-16 | Disposition: A | Discharge status: Home / Self Care | Payer: BLUE CROSS/BLUE SHIELD | Source: Ambulatory Visit | Attending: Surgery | Admitting: Surgery

## 2014-09-16 ENCOUNTER — Encounter (HOSPITAL_BASED_OUTPATIENT_CLINIC_OR_DEPARTMENT_OTHER)
Admission: RE | Disposition: A | Discharge status: Home / Self Care | Payer: Self-pay | Source: Ambulatory Visit | Attending: Surgery

## 2014-09-16 ENCOUNTER — Encounter (HOSPITAL_BASED_OUTPATIENT_CLINIC_OR_DEPARTMENT_OTHER): Payer: Self-pay | Admitting: Anesthesiology

## 2014-09-16 DIAGNOSIS — Z419 Encounter for procedure for purposes other than remedying health state, unspecified: Secondary | ICD-10-CM

## 2014-09-16 DIAGNOSIS — Z793 Long term (current) use of hormonal contraceptives: Secondary | ICD-10-CM | POA: Diagnosis not present

## 2014-09-16 DIAGNOSIS — Z79899 Other long term (current) drug therapy: Secondary | ICD-10-CM | POA: Diagnosis not present

## 2014-09-16 DIAGNOSIS — K828 Other specified diseases of gallbladder: Secondary | ICD-10-CM | POA: Diagnosis not present

## 2014-09-16 DIAGNOSIS — E039 Hypothyroidism, unspecified: Secondary | ICD-10-CM | POA: Diagnosis not present

## 2014-09-16 DIAGNOSIS — K219 Gastro-esophageal reflux disease without esophagitis: Secondary | ICD-10-CM | POA: Diagnosis not present

## 2014-09-16 DIAGNOSIS — Z79891 Long term (current) use of opiate analgesic: Secondary | ICD-10-CM | POA: Insufficient documentation

## 2014-09-16 DIAGNOSIS — K8020 Calculus of gallbladder without cholecystitis without obstruction: Secondary | ICD-10-CM | POA: Diagnosis present

## 2014-09-16 HISTORY — PX: CHOLECYSTECTOMY: SHX55

## 2014-09-16 HISTORY — DX: Gastro-esophageal reflux disease without esophagitis: K21.9

## 2014-09-16 HISTORY — DX: Calculus of gallbladder without cholecystitis without obstruction: K80.20

## 2014-09-16 LAB — POCT HEMOGLOBIN-HEMACUE: HEMOGLOBIN: 15.4 g/dL — AB (ref 12.0–15.0)

## 2014-09-16 SURGERY — LAPAROSCOPIC CHOLECYSTECTOMY WITH INTRAOPERATIVE CHOLANGIOGRAM
Anesthesia: General | Site: Abdomen

## 2014-09-16 MED ORDER — ROCURONIUM BROMIDE 100 MG/10ML IV SOLN
INTRAVENOUS | Status: DC | PRN
  Administered 2014-09-16: 40 mg via INTRAVENOUS

## 2014-09-16 MED ORDER — CHLORHEXIDINE GLUCONATE 4 % EX LIQD: 1.0000 "application " | Freq: Once | CUTANEOUS | Status: DC

## 2014-09-16 MED ORDER — HYDROMORPHONE HCL 1 MG/ML IJ SOLN
0.2500 mg | INTRAMUSCULAR | Status: DC | PRN
  Administered 2014-09-16: 0.5 mg via INTRAVENOUS
  Administered 2014-09-16: 0.25 mg via INTRAVENOUS

## 2014-09-16 MED ORDER — HYDROMORPHONE HCL 1 MG/ML IJ SOLN
INTRAMUSCULAR | Status: AC
  Filled 2014-09-16: qty 1

## 2014-09-16 MED ORDER — DEXAMETHASONE SODIUM PHOSPHATE 4 MG/ML IJ SOLN
INTRAMUSCULAR | Status: DC | PRN
  Administered 2014-09-16: 10 mg via INTRAVENOUS

## 2014-09-16 MED ORDER — LACTATED RINGERS IV SOLN
INTRAVENOUS | Status: DC
  Administered 2014-09-16 (×2): via INTRAVENOUS

## 2014-09-16 MED ORDER — FENTANYL CITRATE (PF) 100 MCG/2ML IJ SOLN
INTRAMUSCULAR | Status: AC
  Filled 2014-09-16: qty 2

## 2014-09-16 MED ORDER — FENTANYL CITRATE (PF) 100 MCG/2ML IJ SOLN
INTRAMUSCULAR | Status: DC | PRN
  Administered 2014-09-16: 100 ug via INTRAVENOUS
  Administered 2014-09-16 (×2): 50 ug via INTRAVENOUS

## 2014-09-16 MED ORDER — METOCLOPRAMIDE HCL 5 MG/ML IJ SOLN
INTRAMUSCULAR | Status: DC | PRN
  Administered 2014-09-16: 10 mg via INTRAVENOUS

## 2014-09-16 MED ORDER — CEFAZOLIN SODIUM-DEXTROSE 2-3 GM-% IV SOLR
INTRAVENOUS | Status: AC
  Filled 2014-09-16: qty 50

## 2014-09-16 MED ORDER — KETOROLAC TROMETHAMINE 30 MG/ML IJ SOLN
30.0000 mg | Freq: Once | INTRAMUSCULAR | Status: AC | PRN
  Administered 2014-09-16: 30 mg via INTRAVENOUS

## 2014-09-16 MED ORDER — PROMETHAZINE HCL 25 MG/ML IJ SOLN: 6.2500 mg | INTRAMUSCULAR | Status: DC | PRN

## 2014-09-16 MED ORDER — BUPIVACAINE-EPINEPHRINE 0.25% -1:200000 IJ SOLN
INTRAMUSCULAR | Status: DC | PRN
  Administered 2014-09-16: 18 mL

## 2014-09-16 MED ORDER — MIDAZOLAM HCL 5 MG/5ML IJ SOLN
INTRAMUSCULAR | Status: DC | PRN
  Administered 2014-09-16: 2 mg via INTRAVENOUS

## 2014-09-16 MED ORDER — KETOROLAC TROMETHAMINE 30 MG/ML IJ SOLN
INTRAMUSCULAR | Status: AC
  Filled 2014-09-16: qty 1

## 2014-09-16 MED ORDER — LIDOCAINE HCL (CARDIAC) 20 MG/ML IV SOLN
INTRAVENOUS | Status: DC | PRN
  Administered 2014-09-16: 7 mg via INTRAVENOUS

## 2014-09-16 MED ORDER — FENTANYL CITRATE (PF) 100 MCG/2ML IJ SOLN
INTRAMUSCULAR | Status: AC
  Filled 2014-09-16: qty 4

## 2014-09-16 MED ORDER — GLYCOPYRROLATE 0.2 MG/ML IJ SOLN
INTRAMUSCULAR | Status: DC | PRN
  Administered 2014-09-16: .4 mg via INTRAVENOUS

## 2014-09-16 MED ORDER — SODIUM CHLORIDE 0.9 % IR SOLN
Status: DC | PRN
  Administered 2014-09-16: 1000 mL

## 2014-09-16 MED ORDER — CEFAZOLIN SODIUM-DEXTROSE 2-3 GM-% IV SOLR
2.0000 g | INTRAVENOUS | Status: AC
  Administered 2014-09-16: 2 g via INTRAVENOUS

## 2014-09-16 MED ORDER — ONDANSETRON HCL 4 MG PO TABS: 4.0000 mg | ORAL_TABLET | Freq: Three times a day (TID) | ORAL | Status: DC | PRN

## 2014-09-16 MED ORDER — NEOSTIGMINE METHYLSULFATE 10 MG/10ML IV SOLN
INTRAVENOUS | Status: DC | PRN
  Administered 2014-09-16: 3 mg via INTRAVENOUS

## 2014-09-16 MED ORDER — OXYCODONE-ACETAMINOPHEN 5-325 MG PO TABS: 1.0000 | ORAL_TABLET | ORAL | Status: DC | PRN

## 2014-09-16 MED ORDER — SODIUM CHLORIDE 0.9 % IV SOLN
INTRAVENOUS | Status: DC | PRN
  Administered 2014-09-16: 3 mL

## 2014-09-16 MED ORDER — ONDANSETRON HCL 4 MG/2ML IJ SOLN
INTRAMUSCULAR | Status: DC | PRN
  Administered 2014-09-16: 4 mg via INTRAVENOUS

## 2014-09-16 MED ORDER — PROPOFOL 10 MG/ML IV BOLUS
INTRAVENOUS | Status: DC | PRN
  Administered 2014-09-16: 200 mg via INTRAVENOUS

## 2014-09-16 MED ORDER — MIDAZOLAM HCL 2 MG/2ML IJ SOLN
INTRAMUSCULAR | Status: AC
  Filled 2014-09-16: qty 2

## 2014-09-16 MED ORDER — FENTANYL CITRATE (PF) 100 MCG/2ML IJ SOLN
INTRAMUSCULAR | Status: AC
  Filled 2014-09-16: qty 6

## 2014-09-16 SURGICAL SUPPLY — 38 items
APPLIER CLIP ROT 10 11.4 M/L (STAPLE) ×2
BLADE CLIPPER SURG (BLADE) IMPLANT
CANISTER SUCT 1200ML W/VALVE (MISCELLANEOUS) ×2 IMPLANT
CHLORAPREP W/TINT 26ML (MISCELLANEOUS) ×4 IMPLANT
CLIP APPLIE ROT 10 11.4 M/L (STAPLE) ×1 IMPLANT
COVER MAYO STAND STRL (DRAPES) IMPLANT
DECANTER SPIKE VIAL GLASS SM (MISCELLANEOUS) IMPLANT
DRAPE C-ARM 42X72 X-RAY (DRAPES) IMPLANT
DRAPE LAPAROSCOPIC ABDOMINAL (DRAPES) ×2 IMPLANT
ELECT REM PT RETURN 9FT ADLT (ELECTROSURGICAL) ×2
ELECTRODE REM PT RTRN 9FT ADLT (ELECTROSURGICAL) ×1 IMPLANT
FILTER SMOKE EVAC LAPAROSHD (FILTER) ×2 IMPLANT
GLOVE BIO SURGEON STRL SZ8 (GLOVE) ×2 IMPLANT
GLOVE BIOGEL PI IND STRL 8 (GLOVE) ×1 IMPLANT
GLOVE BIOGEL PI INDICATOR 8 (GLOVE) ×1
GOWN STRL REUS W/ TWL LRG LVL3 (GOWN DISPOSABLE) ×3 IMPLANT
GOWN STRL REUS W/TWL LRG LVL3 (GOWN DISPOSABLE) ×3
HEMOSTAT SNOW SURGICEL 2X4 (HEMOSTASIS) ×2 IMPLANT
LINER CANISTER 1000CC FLEX (MISCELLANEOUS) IMPLANT
LIQUID BAND (GAUZE/BANDAGES/DRESSINGS) ×2 IMPLANT
NS IRRIG 1000ML POUR BTL (IV SOLUTION) IMPLANT
PACK BASIN DAY SURGERY FS (CUSTOM PROCEDURE TRAY) ×2 IMPLANT
POUCH SPECIMEN RETRIEVAL 10MM (ENDOMECHANICALS) ×2 IMPLANT
SCISSORS LAP 5X35 DISP (ENDOMECHANICALS) ×2 IMPLANT
SET CHOLANGIOGRAPH 5 50 .035 (SET/KITS/TRAYS/PACK) IMPLANT
SET IRRIG TUBING LAPAROSCOPIC (IRRIGATION / IRRIGATOR) ×2 IMPLANT
SLEEVE ENDOPATH XCEL 5M (ENDOMECHANICALS) ×2 IMPLANT
SLEEVE SCD COMPRESS KNEE MED (MISCELLANEOUS) ×2 IMPLANT
STRIP CLOSURE SKIN 1/2X4 (GAUZE/BANDAGES/DRESSINGS) IMPLANT
SUT MNCRL AB 4-0 PS2 18 (SUTURE) ×2 IMPLANT
SUT VICRYL 0 UR6 27IN ABS (SUTURE) IMPLANT
TOWEL OR 17X24 6PK STRL BLUE (TOWEL DISPOSABLE) ×2 IMPLANT
TRAY LAPAROSCOPIC (CUSTOM PROCEDURE TRAY) ×2 IMPLANT
TROCAR XCEL BLUNT TIP 100MML (ENDOMECHANICALS) ×2 IMPLANT
TROCAR XCEL NON-BLD 11X100MML (ENDOMECHANICALS) ×2 IMPLANT
TROCAR XCEL NON-BLD 5MMX100MML (ENDOMECHANICALS) ×2 IMPLANT
TUBE CONNECTING 20X1/4 (TUBING) ×2 IMPLANT
TUBING INSUFFLATION (TUBING) ×2 IMPLANT

## 2014-09-16 NOTE — Interval H&P Note (Signed)
History and Physical Interval Note:  09/16/2014 12:41 PM  Katie Coffey  has presented today for surgery, with the diagnosis of Gallstones  The various methods of treatment have been discussed with the patient and family. After consideration of risks, benefits and other options for treatment, the patient has consented to  Procedure(s): LAPAROSCOPIC CHOLECYSTECTOMY WITH INTRAOPERATIVE CHOLANGIOGRAM (N/A) as a surgical intervention .  The patient's history has been reviewed, patient examined, no change in status, stable for surgery.  I have reviewed the patient's chart and labs.  Questions were answered to the patient's satisfaction.     [name]

## 2014-09-16 NOTE — Op Note (Signed)
Laparoscopic Cholecystectomy with IOC Procedure Note  Indications: This patient presents with symptomatic gallbladder disease and will undergo laparoscopic cholecystectomy. The procedure has been discussed with the patient. Operative and non operative treatments have been discussed. Risks of surgery include bleeding, infection,  Common bile duct injury,  Injury to the stomach,liver, colon,small intestine, abdominal wall,  Diaphragm,  Major blood vessels,  And the need for an open procedure.  Other risks include worsening of medical problems, death,  DVT and pulmonary embolism, and cardiovascular events.   Medical options have also been discussed. The patient has been informed of long term expectations of surgery and non surgical options,  The patient agrees to proceed.    Pre-operative Diagnosis: Calculus of gallbladder without mention of cholecystitis or obstruction  Post-operative Diagnosis: Same  Surgeon: Kalilah Barua A.   Assistants: OR staff  Anesthesia: General endotracheal anesthesia and Local anesthesia 0.25.% bupivacaine, with epinephrine  ASA Class: 1  Procedure Details  The patient was seen again in the Holding Room. The risks, benefits, complications, treatment options, and expected outcomes were discussed with the patient. The possibilities of reaction to medication, pulmonary aspiration, perforation of viscus, bleeding, recurrent infection, finding a normal gallbladder, the need for additional procedures, failure to diagnose a condition, the possible need to convert to an open procedure, and creating a complication requiring transfusion or operation were discussed with the patient. The patient and/or family concurred with the proposed plan, giving informed consent. The site of surgery properly noted/marked. The patient was taken to Operating Room, identified as Christella Hartigan and the procedure verified as Laparoscopic Cholecystectomy with Intraoperative Cholangiograms. A Time Out was  held and the above information confirmed.  Prior to the induction of general anesthesia, antibiotic prophylaxis was administered. General endotracheal anesthesia was then administered and tolerated well. After the induction, the abdomen was prepped in the usual sterile fashion. The patient was positioned in the supine position with the left arm comfortably tucked, along with some reverse Trendelenburg.  Local anesthetic agent was injected into the skin near the umbilicus and an incision made. The midline fascia was incised and the Hasson technique was used to introduce a 12 mm port under direct vision. It was secured with a figure of eight Vicryl suture placed in the usual fashion. Pneumoperitoneum was then created with CO2 and tolerated well without any adverse changes in the patient's vital signs. Additional trocars were introduced under direct vision with an 11 mm trocar in the epigastrium and two 5 mm trocars in the right upper quadrant. All skin incisions were infiltrated with a local anesthetic agent before making the incision and placing the trocars.   The gallbladder was identified, the fundus grasped and retracted cephalad. Adhesions were lysed bluntly and with the electrocautery where indicated, taking care not to injure any adjacent organs or viscus. The infundibulum was grasped and retracted laterally, exposing the peritoneum overlying the triangle of Calot. This was then divided and exposed in a blunt fashion. The cystic duct was clearly identified and bluntly dissected circumferentially. The junctions of the gallbladder, cystic duct and common bile duct were clearly identified prior to the division of any linear structure.   An incision was made in the cystic duct and the cholangiogram catheter introduced. The catheter was secured using an endoclip. The study showed no stones and good visualization of the distal and proximal biliary tree. The catheter was then removed.   The cystic duct was  then  ligated with surgical clips  on the patient side  and  clipped on the gallbladder side and divided. The cystic artery was identified, dissected free, ligated with clips and divided as well. Posterior cystic artery clipped and divided.  The gallbladder was dissected from the liver bed in retrograde fashion with the electrocautery. The gallbladder was removed with Endocatch bag via the umbilical incision. The liver bed was irrigated and inspected. Hemostasis was achieved with the electrocautery. Copious irrigation was utilized and was repeatedly aspirated until clear all particulate matter. Hemostasis was achieved with no signs  of bleeding or bile leakage.  Pneumoperitoneum was completely reduced after viewing removal of the trocars under direct vision. The wound was thoroughly irrigated and the fascia was then closed with a figure of eight suture; the skin was then closed with 4 0 monocryl  and a sterile dressing was applied of liquid adhesive.   Instrument, sponge, and needle counts were correct at closure and at the conclusion of the case.   Findings:  Cholelithiasis  Estimated Blood Loss: Minimal         Drains: none         Total IV Fluids: 700 mL         Specimens: Gallbladder           Complications: None; patient tolerated the procedure well.         Disposition: PACU - hemodynamically stable.         Condition: stable

## 2014-09-16 NOTE — Anesthesia Preprocedure Evaluation (Signed)
Anesthesia Evaluation  Patient identified by MRN, date of birth, ID band Patient awake    Reviewed: Allergy & Precautions, NPO status , Patient's Chart, lab work & pertinent test results  Airway Mallampati: II  TM Distance: >3 FB Neck ROM: Full    Dental no notable dental hx.    Pulmonary neg pulmonary ROS,  breath sounds clear to auscultation  Pulmonary exam normal       Cardiovascular negative cardio ROS Normal cardiovascular examRhythm:Regular Rate:Normal     Neuro/Psych negative neurological ROS  negative psych ROS   GI/Hepatic Neg liver ROS, GERD-  Medicated,  Endo/Other  Hypothyroidism   Renal/GU negative Renal ROS  negative genitourinary   Musculoskeletal negative musculoskeletal ROS (+)   Abdominal   Peds negative pediatric ROS (+)  Hematology negative hematology ROS (+)   Anesthesia Other Findings   Reproductive/Obstetrics negative OB ROS                             Anesthesia Physical Anesthesia Plan  ASA: II  Anesthesia Plan: General   Post-op Pain Management:    Induction: Intravenous  Airway Management Planned: Oral ETT  Additional Equipment:   Intra-op Plan:   Post-operative Plan: Extubation in OR  Informed Consent: I have reviewed the patients History and Physical, chart, labs and discussed the procedure including the risks, benefits and alternatives for the proposed anesthesia with the patient or authorized representative who has indicated his/her understanding and acceptance.   Dental advisory given  Plan Discussed with: CRNA and Surgeon  Anesthesia Plan Comments:         Anesthesia Quick Evaluation

## 2014-09-16 NOTE — Transfer of Care (Signed)
Immediate Anesthesia Transfer of Care Note  Patient: Katie Coffey  Procedure(s) Performed: Procedure(s): LAPAROSCOPIC CHOLECYSTECTOMY WITH INTRAOPERATIVE CHOLANGIOGRAM (N/A)  Patient Location: PACU  Anesthesia Type:General  Level of Consciousness: awake, alert  and oriented  Airway & Oxygen Therapy: Patient Spontanous Breathing and Patient connected to face mask oxygen  Post-op Assessment: Report given to RN and Post -op Vital signs reviewed and stable  Post vital signs: Reviewed and stable  Last Vitals:  Filed Vitals:   09/16/14 1219  BP: 135/95  Pulse: 52  Temp: 36.9 C  Resp: 20    Complications: No apparent anesthesia complications

## 2014-09-16 NOTE — H&P (View-Only) (Signed)
Katie Coffey 09/06/2014 9:40 AM Location: [facility] Patient #: [id number] DOB: 1971-01-16 Married / Language: Undefined / Race: Undefined Female History of Present Illness (Glenis Musolf A. Xzaiver Vayda MD; 09/06/2014 11:03 AM) Patient words: NP, GB Pt presents with hx of RUQ pain after eating for aa number of months. This worsened sunday and she was seen in the [facility] ER. Pt had UQ apin/CP for 4 hours and it would not go away. Has a hx of this type of pain. U/S showed galstones and CBD 5.6 mm . LFT normal except mild elevation of AST. She is currently asymptomatic. Watches her fatt intake.     CLINICAL DATA: Right upper quadrant pain with nausea and vomiting for 4 hr. Chest and abdominal pain. EXAM: US ABDOMEN LIMITED - RIGHT UPPER QUADRANT COMPARISON: None. FINDINGS: Gallbladder: Physiologically distended containing multiple mobile gallstones. Mild gallbladder wall thickening of 3.6 mm. No sonographic Murphy sign noted. Common bile duct: Diameter: 5.3 mm, normal. Liver: Simple cyst noted in the right and left hepatic lobe, largest in the right lobe measures 2.1 x 2.3 x 2.0 cm no solid lesions. Otherwise within normal limits in parenchymal echogenicity. IMPRESSION: 1. Cholelithiasis with borderline mild gallbladder wall thickening 3.6 mm, however negative sonographic Murphy sign. Nuclear medicine hepatobiliary scan could be considered for evaluation of acute cholecystitis based on clinical concern. 2. Hepatic cysts. Electronically Signed By: Jeb Levering M.D. On: 09/05/2014 02:49    Interpretation Summary CLINICAL DATA: Right upper quadrant pain with nausea and vomiting for 4 hr. Chest and abdominal pain. EXAM: US ABDOMEN LIMITED - RIGHT UPPER QUADRANT COMPARISON: None. FINDINGS: Gallbladder: Physiologically distended containing multiple mobile gallstones. Mild gallbladder wall thickening of 3.6 mm. No sonographic Murphy sign noted. Common bile duct:  Diameter: 5.3 mm, normal. Liver: Simple cyst noted in the right and left hepatic lobe, largest in the right lobe measures 2.1 x 2.3 x 2.0 cm no solid lesions. Otherwise within normal limits in parenchymal echogenicity. IMPRESSION: 1. Cholelithiasis with borderline mild gallbladder wall thickening 3.6 mm, however negative sonographic Murphy sign. Nuclear medicine hepatobiliary scan could be considered for evaluation of acute cholecystitis based on clinical concern. 2. Hepatic cysts. Electronically Signed By: Jeb Levering M.D. On: 09/05/2014 02:49.  The patient is a 44 year old female   Other Problems Yehuda Mao, RMA; 09/06/2014 9:40 AM) Back Pain Chest pain Cholelithiasis Hemorrhoids Thyroid Disease  Past Surgical History Yehuda Mao, RMA; 09/06/2014 9:40 AM) Cesarean Section - Multiple  Diagnostic Studies History Yehuda Mao, RMA; 09/06/2014 9:40 AM) Colonoscopy within last year Mammogram within last year Pap Smear 1-5 years ago  Allergies Yehuda Mao, RMA; 09/06/2014 9:43 AM) No Known Drug Allergies 09/06/2014  Medication History Shirlean Mylar Gwynn, RMA; 09/06/2014 9:44 AM) Lo Loestrin Fe (1 MG-10 MCG /10 MCG Tablet, Oral daily) Active. Synthroid (75MCG Tablet, Oral daily) Active. Hydrocodone-Acetaminophen (5-325MG  Tablet, Oral as needed) Active. PriLOSEC (20MG  Capsule DR, Oral daily) Active. Medications Reconciled  Social History Yehuda Mao, RMA; 09/06/2014 9:40 AM) Alcohol use Occasional alcohol use. No caffeine use No drug use Tobacco use Never smoker.  Family History Yehuda Mao, [place]; 09/06/2014 9:40 AM) Arthritis Mother. Colon Cancer Family Members In General. Colon Polyps Father, Mother. Hypertension Brother. Prostate Cancer Father. Thyroid problems Mother.  Pregnancy / Birth History Yehuda Mao, RMA; 09/06/2014 9:40 AM) Age at menarche 67 years. Contraceptive History Oral contraceptives. Gravida 6 Irregular  periods Maternal age 25-30 Para 3     Review of Systems Yehuda Mao RMA; 09/06/2014 9:40 AM) General Not Present- Appetite Loss,  Chills, Fatigue, Fever, Night Sweats, Weight Gain and Weight Loss. Skin Not Present- Change in Wart/Mole, Dryness, Hives, Jaundice, New Lesions, Non-Healing Wounds, Rash and Ulcer. HEENT Present- Wears glasses/contact lenses. Not Present- Earache, Hearing Loss, Hoarseness, Nose Bleed, Oral Ulcers, Ringing in the Ears, Seasonal Allergies, Sinus Pain, Sore Throat, Visual Disturbances and Yellow Eyes. Respiratory Not Present- Bloody sputum, Chronic Cough, Difficulty Breathing, Snoring and Wheezing. Breast Not Present- Breast Mass, Breast Pain, Nipple Discharge and Skin Changes. Cardiovascular Not Present- Chest Pain, Difficulty Breathing Lying Down, Leg Cramps, Palpitations, Rapid Heart Rate, Shortness of Breath and Swelling of Extremities. Gastrointestinal Not Present- Abdominal Pain, Bloating, Bloody Stool, Change in Bowel Habits, Chronic diarrhea, Constipation, Difficulty Swallowing, Excessive gas, Gets full quickly at meals, Hemorrhoids, Indigestion, Nausea, Rectal Pain and Vomiting. Female Genitourinary Not Present- Frequency, Nocturia, Painful Urination, Pelvic Pain and Urgency. Musculoskeletal Present- Back Pain. Not Present- Joint Pain, Joint Stiffness, Muscle Pain, Muscle Weakness and Swelling of Extremities. Neurological Not Present- Decreased Memory, Fainting, Headaches, Numbness, Seizures, Tingling, Tremor, Trouble walking and Weakness. Psychiatric Not Present- Anxiety, Bipolar, Change in Sleep Pattern, Depression, Fearful and Frequent crying. Endocrine Not Present- Cold Intolerance, Excessive Hunger, Hair Changes, Heat Intolerance, Hot flashes and New Diabetes. Hematology Not Present- Easy Bruising, Excessive bleeding, Gland problems, HIV and Persistent Infections.  Vitals (Robin Gwynn RMA; 09/06/2014 9:45 AM) 09/06/2014 9:44 AM Weight: 127.4 lb Height:  61in Body Surface Area: 1.58 m Body Mass Index: 24.07 kg/m Temp.: 98.15F  Pulse: 48 (Regular)  BP: 120/80 (Sitting, Left Arm, Standard)     Physical Exam (Erlean Mealor A. Tashayla Therien MD; 09/06/2014 11:04 AM)  General Mental Status-Alert. General Appearance-Consistent with stated age. Hydration-Well hydrated. Voice-Normal.  Head and Neck Head-normocephalic, atraumatic with no lesions or palpable masses.  Eye Eyeball - Bilateral-Extraocular movements intact. Sclera/Conjunctiva - Bilateral-No scleral icterus.  Abdomen Inspection Inspection of the abdomen reveals - No Hernias. Skin - Scar - no surgical scars. Palpation/Percussion Palpation and Percussion of the abdomen reveal - Soft, Non Tender, No Rebound tenderness, No Rigidity (guarding) and No hepatosplenomegaly. Auscultation Auscultation of the abdomen reveals - Bowel sounds normal. Note: c section scar   Neurologic Neurologic evaluation reveals -alert and oriented x 3 with no impairment of recent or remote memory. Mental Status-Normal.  Musculoskeletal Normal Exam - Left-Upper Extremity Strength Normal and Lower Extremity Strength Normal. Normal Exam - Right-Upper Extremity Strength Normal, Lower Extremity Weakness.    Assessment & Plan (Matthew Cina A. Xee Hollman MD; 09/06/2014 10:12 AM)  CHOLELITHIASIS (574.20  K80.20) Impression: recommend laproscopic cholecystectomy with cholangiogram The procedure has been discussed with the patient. Risks of laparoscopic cholecystectomy include bleeding, infection, bile duct injury, leak, death, open surgery, diarrhea, other surgery, organ injury, blood vessel injury, DVT, and additional care.  Current Plans Pt Education - [facility] Laparosopic Post Op [facility]CI (Gross) Pt Education - [facility] Laparoscopic Surgery HCI Pt Education - Gallstones: discussed with patient and provided information.

## 2014-09-16 NOTE — Discharge Instructions (Signed)
[facility] ______[facility] °LAPAROSCOPIC SURGERY: POST OP INSTRUCTIONS °Always review your discharge instruction sheet given to you by the facility where your surgery was performed. °IF YOU HAVE DISABILITY OR FAMILY LEAVE FORMS, YOU MUST BRING THEM TO THE OFFICE FOR PROCESSING.   °DO NOT GIVE THEM TO YOUR DOCTOR. ° °1. A prescription for pain medication may be given to you upon discharge.  Take your pain medication as prescribed, if needed.  If narcotic pain medicine is not needed, then you may take acetaminophen (Tylenol) or ibuprofen (Advil) as needed. °2. Take your usually prescribed medications unless otherwise directed. °3. If you need a refill on your pain medication, please contact your pharmacy.  They will contact our office to request authorization. Prescriptions will not be filled after 5pm or on week-ends. °4. You should follow a light diet the first few days after arrival home, such as soup and crackers, etc.  Be sure to include lots of fluids daily. °5. Most patients will experience some swelling and bruising in the area of the incisions.  Ice packs will help.  Swelling and bruising can take several days to resolve.  °6. It is common to experience some constipation if taking pain medication after surgery.  Increasing fluid intake and taking a stool softener (such as Colace) will usually help or prevent this problem from occurring.  A mild laxative (Milk of Magnesia or Miralax) should be taken according to package instructions if there are no bowel movements after 48 hours. °7. Unless discharge instructions indicate otherwise, you may remove your bandages 24-48 hours after surgery, and you may shower at that time.  You may have steri-strips (small skin tapes) in place directly over the incision.  These strips should be left on the skin for 7-10 days.  If your surgeon used skin glue on the incision, you may shower in 24 hours.  The glue will flake off over the next 2-3 weeks.  Any sutures or  staples will be removed at the office during your follow-up visit. °8. ACTIVITIES:  You may resume regular (light) daily activities beginning the next day--such as daily self-care, walking, climbing stairs--gradually increasing activities as tolerated.  You may have sexual intercourse when it is comfortable.  Refrain from any heavy lifting or straining until approved by your doctor. °a. You may drive when you are no longer taking prescription pain medication, you can comfortably wear a seatbelt, and you can safely maneuver your car and apply brakes. °b. RETURN TO WORK:  __________________________________________________________ °9. You should see your doctor in the office for a follow-up appointment approximately 2-3 weeks after your surgery.  Make sure that you call for this appointment within a day or two after you arrive home to insure a convenient appointment time. °10. OTHER INSTRUCTIONS: __________________________________________________________________________________________________________________________ __________________________________________________________________________________________________________________________ °WHEN TO CALL YOUR DOCTOR: °1. Fever over 101.0 °2. Inability to urinate °3. Continued bleeding from incision. °4. Increased pain, redness, or drainage from the incision. °5. Increasing abdominal pain ° °The clinic staff is available to answer your questions during regular business hours.  Please don’t hesitate to call and ask to speak to one of the nurses for clinical concerns.  If you have a medical emergency, go to the nearest emergency room or call 911.  A surgeon from [facility] is always on call at the hospital. °[address], [address], [place], [place]  [address] ? [address] [address], [place], [place]   [address] °[phone] ? [phone] ? FAX [phone] °Web site:   [email or web address] ° °Post Anesthesia Home Care Instructions ° °Activity: °Get  plenty of rest for the remainder of the day. A responsible adult should stay with you for 24 hours following the procedure.  °For the next 24 hours, DO NOT: °-Drive a car °-Operate machinery °-Drink alcoholic beverages °-Take any medication unless instructed by your physician °-Make any legal decisions or sign important papers. ° °Meals: °Start with liquid foods such as gelatin or soup. Progress to regular foods as tolerated. Avoid greasy, spicy, heavy foods. If nausea and/or vomiting occur, drink only clear liquids until the nausea and/or vomiting subsides. Call your physician if vomiting continues. ° °Special Instructions/Symptoms: °Your throat may feel dry or sore from the anesthesia or the breathing tube placed in your throat during surgery. If this causes discomfort, gargle with warm salt water. The discomfort should disappear within 24 hours. ° °If you had a scopolamine patch placed behind your ear for the management of post- operative nausea and/or vomiting: ° °1. The medication in the patch is effective for 72 hours, after which it should be removed.  Wrap patch in a tissue and discard in the trash. Wash hands thoroughly with soap and water. °2. You may remove the patch earlier than 72 hours if you experience unpleasant side effects which may include dry mouth, dizziness or visual disturbances. °3. Avoid touching the patch. Wash your hands with soap and water after contact with the patch. °  ° °

## 2014-09-16 NOTE — Anesthesia Procedure Notes (Signed)
Procedure Name: Intubation Date/Time: 09/16/2014 1:12 PM Performed by: Melynda Ripple D Pre-anesthesia Checklist: Patient identified, Emergency Drugs available, Suction available and Patient being monitored Patient Re-evaluated:Patient Re-evaluated prior to inductionOxygen Delivery Method: Circle System Utilized Preoxygenation: Pre-oxygenation with 100% oxygen Intubation Type: IV induction Ventilation: Mask ventilation without difficulty Laryngoscope Size: Mac and 3 Grade View: Grade I Tube type: Oral Number of attempts: 1 Airway Equipment and Method: Stylet and Oral airway Placement Confirmation: ETT inserted through vocal cords under direct vision,  positive ETCO2 and breath sounds checked- equal and bilateral Secured at: 22 cm Tube secured with: Tape Dental Injury: Teeth and Oropharynx as per pre-operative assessment

## 2014-09-16 NOTE — Anesthesia Postprocedure Evaluation (Signed)
  Anesthesia Post-op Note  Patient: Katie Coffey  Procedure(s) Performed: Procedure(s): LAPAROSCOPIC CHOLECYSTECTOMY WITH INTRAOPERATIVE CHOLANGIOGRAM (N/A)  Patient Location: PACU  Anesthesia Type:General  Level of Consciousness: awake and alert   Airway and Oxygen Therapy: Patient Spontanous Breathing  Post-op Pain: mild  Post-op Assessment: Post-op Vital signs reviewed              Post-op Vital Signs: Reviewed  Last Vitals:  Filed Vitals:   09/16/14 1558  BP:   Pulse: 51  Temp:   Resp: 13    Complications: No apparent anesthesia complications

## 2014-09-19 ENCOUNTER — Encounter (HOSPITAL_BASED_OUTPATIENT_CLINIC_OR_DEPARTMENT_OTHER): Payer: Self-pay | Admitting: Surgery

## 2014-11-09 ENCOUNTER — Ambulatory Visit: Payer: BLUE CROSS/BLUE SHIELD | Admitting: Internal Medicine

## 2015-07-07 ENCOUNTER — Other Ambulatory Visit: Payer: Self-pay | Admitting: Family Medicine

## 2015-07-07 ENCOUNTER — Ambulatory Visit
Admission: RE | Admit: 2015-07-07 | Discharge: 2015-07-07 | Disposition: A | Discharge status: Home / Self Care | Payer: BLUE CROSS/BLUE SHIELD | Source: Ambulatory Visit | Attending: Family Medicine | Admitting: Family Medicine

## 2015-07-07 DIAGNOSIS — M542 Cervicalgia: Secondary | ICD-10-CM

## 2016-03-15 DIAGNOSIS — E039 Hypothyroidism, unspecified: Secondary | ICD-10-CM | POA: Diagnosis not present

## 2016-03-21 DIAGNOSIS — H8149 Vertigo of central origin, unspecified ear: Secondary | ICD-10-CM | POA: Diagnosis not present

## 2016-03-21 DIAGNOSIS — H8112 Benign paroxysmal vertigo, left ear: Secondary | ICD-10-CM | POA: Diagnosis not present

## 2016-03-21 DIAGNOSIS — R42 Dizziness and giddiness: Secondary | ICD-10-CM | POA: Diagnosis not present

## 2016-03-29 DIAGNOSIS — H8149 Vertigo of central origin, unspecified ear: Secondary | ICD-10-CM | POA: Diagnosis not present

## 2016-03-29 DIAGNOSIS — H8112 Benign paroxysmal vertigo, left ear: Secondary | ICD-10-CM | POA: Diagnosis not present

## 2016-03-29 DIAGNOSIS — R42 Dizziness and giddiness: Secondary | ICD-10-CM | POA: Diagnosis not present

## 2016-04-02 DIAGNOSIS — Z01419 Encounter for gynecological examination (general) (routine) without abnormal findings: Secondary | ICD-10-CM | POA: Diagnosis not present

## 2016-04-02 DIAGNOSIS — Z6824 Body mass index (BMI) 24.0-24.9, adult: Secondary | ICD-10-CM | POA: Diagnosis not present

## 2016-04-02 DIAGNOSIS — H8112 Benign paroxysmal vertigo, left ear: Secondary | ICD-10-CM | POA: Diagnosis not present

## 2016-04-02 DIAGNOSIS — H8149 Vertigo of central origin, unspecified ear: Secondary | ICD-10-CM | POA: Diagnosis not present

## 2016-04-02 DIAGNOSIS — R42 Dizziness and giddiness: Secondary | ICD-10-CM | POA: Diagnosis not present

## 2016-04-04 DIAGNOSIS — H8112 Benign paroxysmal vertigo, left ear: Secondary | ICD-10-CM | POA: Diagnosis not present

## 2016-04-04 DIAGNOSIS — H8149 Vertigo of central origin, unspecified ear: Secondary | ICD-10-CM | POA: Diagnosis not present

## 2016-04-04 DIAGNOSIS — R42 Dizziness and giddiness: Secondary | ICD-10-CM | POA: Diagnosis not present

## 2016-04-17 DIAGNOSIS — H8143 Vertigo of central origin, bilateral: Secondary | ICD-10-CM | POA: Diagnosis not present

## 2016-09-10 DIAGNOSIS — R51 Headache: Secondary | ICD-10-CM | POA: Diagnosis not present

## 2016-10-04 DIAGNOSIS — G44209 Tension-type headache, unspecified, not intractable: Secondary | ICD-10-CM | POA: Diagnosis not present

## 2016-10-04 DIAGNOSIS — Z791 Long term (current) use of non-steroidal anti-inflammatories (NSAID): Secondary | ICD-10-CM | POA: Diagnosis not present

## 2016-10-04 DIAGNOSIS — E039 Hypothyroidism, unspecified: Secondary | ICD-10-CM | POA: Diagnosis not present

## 2017-04-03 DIAGNOSIS — Z1231 Encounter for screening mammogram for malignant neoplasm of breast: Secondary | ICD-10-CM | POA: Diagnosis not present

## 2017-04-03 DIAGNOSIS — Z01419 Encounter for gynecological examination (general) (routine) without abnormal findings: Secondary | ICD-10-CM | POA: Diagnosis not present

## 2017-04-03 DIAGNOSIS — Z6826 Body mass index (BMI) 26.0-26.9, adult: Secondary | ICD-10-CM | POA: Diagnosis not present

## 2017-08-21 DIAGNOSIS — L247 Irritant contact dermatitis due to plants, except food: Secondary | ICD-10-CM | POA: Diagnosis not present

## 2017-09-10 DIAGNOSIS — H04123 Dry eye syndrome of bilateral lacrimal glands: Secondary | ICD-10-CM | POA: Diagnosis not present

## 2017-09-25 DIAGNOSIS — H6092 Unspecified otitis externa, left ear: Secondary | ICD-10-CM | POA: Diagnosis not present

## 2017-10-10 ENCOUNTER — Encounter (HOSPITAL_COMMUNITY): Payer: Self-pay | Admitting: *Deleted

## 2017-10-10 ENCOUNTER — Other Ambulatory Visit: Payer: Self-pay

## 2017-10-10 ENCOUNTER — Emergency Department (HOSPITAL_COMMUNITY)
Admission: EM | Admit: 2017-10-10 | Discharge: 2017-10-10 | Disposition: A | Discharge status: Home / Self Care | Payer: 59 | Attending: Emergency Medicine | Admitting: Emergency Medicine

## 2017-10-10 DIAGNOSIS — E039 Hypothyroidism, unspecified: Secondary | ICD-10-CM | POA: Insufficient documentation

## 2017-10-10 DIAGNOSIS — M79662 Pain in left lower leg: Secondary | ICD-10-CM | POA: Diagnosis present

## 2017-10-10 DIAGNOSIS — R52 Pain, unspecified: Secondary | ICD-10-CM

## 2017-10-10 DIAGNOSIS — Z79899 Other long term (current) drug therapy: Secondary | ICD-10-CM | POA: Insufficient documentation

## 2017-10-10 DIAGNOSIS — M79605 Pain in left leg: Secondary | ICD-10-CM | POA: Diagnosis not present

## 2017-10-10 MED ORDER — ENOXAPARIN SODIUM 80 MG/0.8ML ~~LOC~~ SOLN
1.0000 mg/kg | Freq: Once | SUBCUTANEOUS | Status: AC
  Administered 2017-10-10: 65 mg via SUBCUTANEOUS
  Filled 2017-10-10: qty 0.65

## 2017-10-10 NOTE — ED Triage Notes (Signed)
Pt arrives ambulatory to triage room with c/o knot and pain in the posterior left calf area that started yesterday afternoon after landing from a a 10 hour flight from [place]. Pt denies cp or sob, does not feel like it is swollen. Took 2 ASA about 5 hours ago.

## 2017-10-10 NOTE — Discharge Instructions (Signed)
Please call and follow up tomorrow for a venous doppler ultrasound to ensure no evidence of deep vein clot to your left leg.

## 2017-10-10 NOTE — ED Notes (Signed)
PT DISCHARGED. INSTRUCTIONS AND PRESCRIPTION GIVEN. AAOX4. PT IN NO APPARENT DISTRESS WITH MILD PAIN. THE OPPORTUNITY TO ASK QUESTIONS WAS PROVIDED. 

## 2017-10-10 NOTE — ED Provider Notes (Signed)
[facility] DEPT Provider Note   CSN: [id number] Arrival date & time: 10/10/17  2026     History   Chief Complaint Chief Complaint  Patient presents with  . Leg Pain    HPI Pablo Q Simeone is a 47 y.o. female.  The history is provided by the patient. No language interpreter was used.  Leg Pain       47 year old female presenting for evaluation of left calf pain.  Patient reports she recently flew to [place] on a 10-hour flight, stated for 10 days, and just flew back in another 10 hours of life with a 5 hours overlay.  Upon returning back home today, she felt pain to her left calf.  She describes the pain as a stabbing pain, worse with standing and rated a 7 out of 10 improved to 3 out of 10 with laying.  She also noticed that her blood vessels was engorged in the back of her calf and it was tender.  She denies any associated fever, chills, lightheadedness, dizziness, chest pain, shortness of breath, productive cough, hemoptysis.  She denies any injury.  No prior history of PE or DVT and no history of active cancer or recent surgery.  She is currently on birth control pill.    Past Medical History:  Diagnosis Date  . Gallstones   . GERD (gastroesophageal reflux disease)   . Hypothyroid     Patient Active Problem List   Diagnosis Date Noted  . Hypothyroid     Past Surgical History:  Procedure Laterality Date  . CESAREAN SECTION     x3  . CHOLECYSTECTOMY N/A 09/16/2014   Procedure: LAPAROSCOPIC CHOLECYSTECTOMY WITH INTRAOPERATIVE CHOLANGIOGRAM;  Surgeon: Erroll Luna, MD;  Location: [facility];  Service: General;  Laterality: N/A;  . OVARIAN CYST SURGERY       OB History   None      Home Medications    Prior to Admission medications   Medication Sig Start Date End Date Taking? Authorizing Provider  HYDROcodone-acetaminophen (NORCO/VICODIN) 5-325 MG per tablet Take 1-2 tablets by mouth every 4 (four) hours as needed.  09/05/14   Charlann Lange, PA-C  levothyroxine (SYNTHROID, LEVOTHROID) 75 MCG tablet Take 75 mcg by mouth daily.    [provider]  norethindrone-ethinyl estradiol-iron (LOESTRIN FE 1.5/30) 1.5-30 MG-MCG tablet Take 1 tablet by mouth daily.    [provider]  omeprazole (PRILOSEC) 20 MG capsule Take 1 capsule (20 mg total) by mouth daily. Take twice daily for 4 days, then once daily afterward 09/05/14   Charlann Lange, PA-C  ondansetron (ZOFRAN) 4 MG tablet Take 1 tablet (4 mg total) by mouth every 6 (six) hours. 09/05/14   Charlann Lange, PA-C  ondansetron (ZOFRAN) 4 MG tablet Take 1 tablet (4 mg total) by mouth every 8 (eight) hours as needed for nausea or vomiting. 09/16/14   Cornett, Marcello Moores, MD  oxyCODONE-acetaminophen (ROXICET) 5-325 MG per tablet Take 1 tablet by mouth every 4 (four) hours as needed. 09/16/14   Erroll Luna, MD    Family History No family history on file.  Social History Social History   Tobacco Use  . Smoking status: Never Smoker  Substance Use Topics  . Alcohol use: Yes    Comment: Once a month.   . Drug use: No     Allergies   Other   Review of Systems Review of Systems  All other systems reviewed and are negative.    Physical Exam Updated Vital  Signs BP (!) 132/99 (BP Location: Left Arm)   Pulse 90   Temp 98.4 F (36.9 C) (Oral)   Resp 16   SpO2 98%   Physical Exam  Constitutional: She appears well-developed and well-nourished. No distress.  HENT:  Head: Atraumatic.  Eyes: Conjunctivae are normal.  Neck: Neck supple.  Cardiovascular: Normal rate and regular rhythm.  Pulmonary/Chest: Effort normal and breath sounds normal. No respiratory distress.  Abdominal: Soft.  Musculoskeletal: She exhibits tenderness (Left leg: Tenderness noted to posterior calf on palpation without obvious induration fluctuant or redness.  Compartment is soft.  Dorsalis pedis pulse palpable with brisk cap refill.  No deformity.). She exhibits no  edema.  Neurological: She is alert.  Skin: No rash noted.  Psychiatric: She has a normal mood and affect.  Nursing note and vitals reviewed.    ED Treatments / Results  Labs (all labs ordered are listed, but only abnormal results are displayed) Labs Reviewed - No data to display  EKG None  Radiology No results found.  Procedures Procedures (including critical care time)  Medications Ordered in ED Medications  enoxaparin (LOVENOX) injection 65 mg (65 mg Subcutaneous Given 10/10/17 2151)     Initial Impression / Assessment and Plan / ED Course  I have reviewed the triage vital signs and the nursing notes.  Pertinent labs & imaging results that were available during my care of the patient were reviewed by me and considered in my medical decision making (see chart for details).     BP (!) 132/99 (BP Location: Left Arm)   Pulse 90   Temp 98.4 F (36.9 C) (Oral)   Resp 16   Ht 5\' 1"  (1.549 m)   Wt 65.8 kg (145 lb)   SpO2 98%   BMI 27.40 kg/m    Final Clinical Impressions(s) / ED Diagnoses   Final diagnoses:  Pain of left calf    ED Discharge Orders        Ordered    LE VENOUS     10/10/17 2159     9:31 PM Patient complaining of left posterior calf pain after a long flight as well as being on birth control pill.  She is at risk for potential DVT.  No evidence of cellulitis or abscess noted on exam.  No recent trauma.  She is neurovascular intact.  No chest pain or shortness of breath.  Since ultrasound is not available at this time, patient will receive Lovenox at 1 mg/kg and will return tomorrow for a venous Doppler study to rule out DVT.  Patient voiced understanding and agrees with plan.   Domenic Moras, PA-C 10/10/17 2201    Margette Fast, MD 10/11/17 [phone]

## 2017-10-11 ENCOUNTER — Other Ambulatory Visit: Payer: Self-pay

## 2017-10-11 ENCOUNTER — Encounter (HOSPITAL_COMMUNITY): Payer: Self-pay | Admitting: Emergency Medicine

## 2017-10-11 ENCOUNTER — Ambulatory Visit (HOSPITAL_BASED_OUTPATIENT_CLINIC_OR_DEPARTMENT_OTHER): Admit: 2017-10-11 | Discharge: 2017-10-11 | Disposition: A | Discharge status: Home / Self Care | Payer: 59

## 2017-10-11 ENCOUNTER — Emergency Department (HOSPITAL_COMMUNITY)
Admission: EM | Admit: 2017-10-11 | Discharge: 2017-10-11 | Disposition: A | Discharge status: Home / Self Care | Payer: 59 | Attending: Emergency Medicine | Admitting: Emergency Medicine

## 2017-10-11 ENCOUNTER — Emergency Department (HOSPITAL_COMMUNITY): Admission: EM | Admit: 2017-10-11 | Discharge: 2017-10-11 | Discharge status: Absent without leave | Payer: 59

## 2017-10-11 DIAGNOSIS — R2242 Localized swelling, mass and lump, left lower limb: Secondary | ICD-10-CM | POA: Diagnosis not present

## 2017-10-11 DIAGNOSIS — I824Y2 Acute embolism and thrombosis of unspecified deep veins of left proximal lower extremity: Secondary | ICD-10-CM

## 2017-10-11 DIAGNOSIS — R52 Pain, unspecified: Secondary | ICD-10-CM

## 2017-10-11 DIAGNOSIS — I8290 Acute embolism and thrombosis of unspecified vein: Secondary | ICD-10-CM

## 2017-10-11 DIAGNOSIS — Z86718 Personal history of other venous thrombosis and embolism: Secondary | ICD-10-CM

## 2017-10-11 HISTORY — DX: Personal history of other venous thrombosis and embolism: Z86.718

## 2017-10-11 MED ORDER — RIVAROXABAN 20 MG PO TABS: 20.0000 mg | ORAL_TABLET | Freq: Every day | ORAL | Status: DC

## 2017-10-11 MED ORDER — RIVAROXABAN (XARELTO) EDUCATION KIT FOR DVT/PE PATIENTS
PACK | Freq: Once | Status: AC
  Administered 2017-10-11: 11:00:00
  Filled 2017-10-11: qty 1

## 2017-10-11 MED ORDER — RIVAROXABAN 15 MG PO TABS
15.0000 mg | ORAL_TABLET | Freq: Two times a day (BID) | ORAL | Status: DC
  Administered 2017-10-11: 15 mg via ORAL
  Filled 2017-10-11: qty 1

## 2017-10-11 NOTE — ED Triage Notes (Signed)
Pt to ER for confirmed DVT to left calf, states was on a 10+ hour flight back from [place] 2 days ago when pain began.

## 2017-10-11 NOTE — Progress Notes (Signed)
Xarelto education completed with patient.

## 2017-10-11 NOTE — ED Provider Notes (Addendum)
Patient returns today after having Doppler of lower extremity due to swelling and concern for DVT.  Patient was seen in the ED yesterday.  She has recently had an Manufacturing systems engineer and has had some pain in her legs since that time.  She is on birth control.  She denies any chest pain or shortness of breath. Check report from vascular lab states acute intramuscular DVT in the left gastrocnemius vein     Signed           Show:Clear all [x] Manual[x] Template[] Copied  Added by: [x] Kanady, Candace R, RVS   [] Hover for details   VASCULAR LAB PRELIMINARY  PRELIMINARY  PRELIMINARY  PRELIMINARY  Left lower extremity venous duplex completed.    Preliminary report:  There is acute, intramuscular DVT noted in the left gastrocnemius vein.    KANADY, CANDACE, RVT 10/11/2017, 8:51 AM          Discussed with Dr. Oneida Alar.  Discussed risk benefits with patient and plan anticoagulation for 1 month.  Patient will follow-up with her primary care doctor in 2 weeks for repeat ultrasound Plan rivaroxaban.  Pharmacy will prepare starter pack prescription and counseled patient.  Discussed return precautions need for follow-up with patient and she voices understanding.  Please take medication as prescribed Recheck with your doctor for repeat US in 2 weeks Return if worsening symptoms especially chest pain or shortness of breath Stop all birth control or external hormones  Pattricia Boss, MD 10/11/17 1027    Pattricia Boss, MD 10/11/17 1031

## 2017-10-11 NOTE — Discharge Instructions (Signed)
Please take medication as prescribed Recheck with your doctor for repeat US in 2 weeks Return if worsening symptoms especially chest pain or shortness of breath Stop all birth control or external hormones

## 2017-10-11 NOTE — Progress Notes (Signed)
VASCULAR LAB PRELIMINARY  PRELIMINARY  PRELIMINARY  PRELIMINARY  Left lower extremity venous duplex completed.    Preliminary report:  There is acute, intramuscular DVT noted in the left gastrocnemius vein.    Myla Mauriello, RVT 10/11/2017, 8:51 AM

## 2017-10-11 NOTE — ED Triage Notes (Signed)
Pt to ER for evaluation of left calf pain. Onset 2 days ago after 10+ hour flight from [place]. No swelling noted, +2 pedal pulses. Pt reports going [facility] ER last night but there was no [place] tech.

## 2017-10-18 ENCOUNTER — Other Ambulatory Visit: Payer: Self-pay

## 2017-10-18 ENCOUNTER — Emergency Department (HOSPITAL_BASED_OUTPATIENT_CLINIC_OR_DEPARTMENT_OTHER): Payer: 59

## 2017-10-18 ENCOUNTER — Encounter (HOSPITAL_COMMUNITY): Payer: Self-pay

## 2017-10-18 ENCOUNTER — Emergency Department (HOSPITAL_COMMUNITY): Payer: 59

## 2017-10-18 ENCOUNTER — Emergency Department (HOSPITAL_COMMUNITY)
Admission: EM | Admit: 2017-10-18 | Discharge: 2017-10-18 | Disposition: A | Discharge status: Home / Self Care | Payer: 59 | Attending: Emergency Medicine | Admitting: Emergency Medicine

## 2017-10-18 DIAGNOSIS — E039 Hypothyroidism, unspecified: Secondary | ICD-10-CM | POA: Diagnosis not present

## 2017-10-18 DIAGNOSIS — R109 Unspecified abdominal pain: Secondary | ICD-10-CM | POA: Diagnosis present

## 2017-10-18 DIAGNOSIS — R197 Diarrhea, unspecified: Secondary | ICD-10-CM | POA: Insufficient documentation

## 2017-10-18 DIAGNOSIS — R102 Pelvic and perineal pain: Secondary | ICD-10-CM | POA: Insufficient documentation

## 2017-10-18 DIAGNOSIS — R1032 Left lower quadrant pain: Secondary | ICD-10-CM | POA: Diagnosis not present

## 2017-10-18 DIAGNOSIS — M79609 Pain in unspecified limb: Secondary | ICD-10-CM | POA: Diagnosis not present

## 2017-10-18 DIAGNOSIS — Z86718 Personal history of other venous thrombosis and embolism: Secondary | ICD-10-CM | POA: Insufficient documentation

## 2017-10-18 DIAGNOSIS — Z7901 Long term (current) use of anticoagulants: Secondary | ICD-10-CM | POA: Insufficient documentation

## 2017-10-18 LAB — CBC
HEMATOCRIT: 48.9 % — AB (ref 36.0–46.0)
HEMOGLOBIN: 15.8 g/dL — AB (ref 12.0–15.0)
MCH: 31.3 pg (ref 26.0–34.0)
MCHC: 32.3 g/dL (ref 30.0–36.0)
MCV: 97 fL (ref 78.0–100.0)
Platelets: 276 10*3/uL (ref 150–400)
RBC: 5.04 MIL/uL (ref 3.87–5.11)
RDW: 12.4 % (ref 11.5–15.5)
WBC: 7.1 10*3/uL (ref 4.0–10.5)

## 2017-10-18 LAB — COMPREHENSIVE METABOLIC PANEL
ALT: 22 U/L (ref 0–44)
ANION GAP: 12 (ref 5–15)
AST: 26 U/L (ref 15–41)
Albumin: 3.5 g/dL (ref 3.5–5.0)
Alkaline Phosphatase: 57 U/L (ref 38–126)
BILIRUBIN TOTAL: 0.7 mg/dL (ref 0.3–1.2)
BUN: 11 mg/dL (ref 6–20)
CHLORIDE: 104 mmol/L (ref 98–111)
CO2: 26 mmol/L (ref 22–32)
Calcium: 9.3 mg/dL (ref 8.9–10.3)
Creatinine, Ser: 0.96 mg/dL (ref 0.44–1.00)
Glucose, Bld: 94 mg/dL (ref 70–99)
POTASSIUM: 4.5 mmol/L (ref 3.5–5.1)
Sodium: 142 mmol/L (ref 135–145)
TOTAL PROTEIN: 7.1 g/dL (ref 6.5–8.1)

## 2017-10-18 LAB — URINALYSIS, ROUTINE W REFLEX MICROSCOPIC
Bilirubin Urine: NEGATIVE
Glucose, UA: NEGATIVE mg/dL
KETONES UR: NEGATIVE mg/dL
Leukocytes, UA: NEGATIVE
NITRITE: NEGATIVE
Protein, ur: NEGATIVE mg/dL
SPECIFIC GRAVITY, URINE: 1.018 (ref 1.005–1.030)
pH: 6 (ref 5.0–8.0)

## 2017-10-18 LAB — LIPASE, BLOOD: LIPASE: 39 U/L (ref 11–51)

## 2017-10-18 LAB — I-STAT BETA HCG BLOOD, ED (MC, WL, AP ONLY)

## 2017-10-18 LAB — I-STAT CG4 LACTIC ACID, ED: Lactic Acid, Venous: 1.59 mmol/L (ref 0.5–1.9)

## 2017-10-18 MED ORDER — LOPERAMIDE HCL 2 MG PO CAPS: 2.0000 mg | ORAL_CAPSULE | Freq: Four times a day (QID) | ORAL | 0 refills | Status: DC | PRN

## 2017-10-18 NOTE — ED Notes (Signed)
Left for US.

## 2017-10-18 NOTE — ED Provider Notes (Signed)
[facility] EMERGENCY DEPARTMENT Provider Note   CSN: [id number] Arrival date & time: 10/18/17  1211     History   Chief Complaint Chief Complaint  Patient presents with  . Abdominal Pain    HPI Katie Coffey is a 47 y.o. female with a history of DVT on Xarelto, GERD, and hypothyroidism who presents to the emergency department from urgent care with a chief complaint of abdominal pain.  The patient endorses constant bilateral lower abdominal pain that she characterizes as dull and pressure-like that began yesterday.  Pain is worse when she presses on the area and with positional changes including going from sitting to standing and turning on her side.  Pain is also worse with coughing and sneezing.  No known alleviating factors.  She reports associated nonbloody loose stools for the last 2 days.  She denies fever, chills, chest pain, dyspnea, dysuria, hematuria, vaginal pain, bleeding, or discharge, melena, or hematochezia.  The patient was seen in the ED on 10/10/2017 after she recently returned on a 10-hour flight from [place] and was diagnosed with a DVT in her left calf and was started on Xarelto. She was also on birth control at that time, which has since been discontinued.  Reports that she was previously on OCPs for ovarian cysts.  She has been compliant with Xarelto at home with no recent missed doses.  She reports significant improvement in the pain in her left calf since starting Xarelto.  She does not feel like the pain today feels similar to her left calf pain.  No known sick contacts.  Surgical history includes cholecystectomy and cesarean section x3.  The history is provided by the patient. No language interpreter was used.  Abdominal Pain   Associated symptoms include diarrhea (loose stools). Pertinent negatives include fever, nausea, vomiting, constipation, dysuria and headaches.   Past Medical History:  Diagnosis Date  . Gallstones   . GERD  (gastroesophageal reflux disease)   . Hypothyroid     Patient Active Problem List   Diagnosis Date Noted  . Hypothyroid     Past Surgical History:  Procedure Laterality Date  . CESAREAN SECTION     x3  . CHOLECYSTECTOMY N/A 09/16/2014   Procedure: LAPAROSCOPIC CHOLECYSTECTOMY WITH INTRAOPERATIVE CHOLANGIOGRAM;  Surgeon: Erroll Luna, MD;  Location: [facility];  Service: General;  Laterality: N/A;  . OVARIAN CYST SURGERY       OB History   None      Home Medications    Prior to Admission medications   Medication Sig Start Date End Date Taking? Authorizing Provider  levothyroxine (SYNTHROID, LEVOTHROID) 100 MCG tablet Take 100 mcg by mouth daily.     [provider]  loperamide (IMODIUM) 2 MG capsule Take 1 capsule (2 mg total) by mouth 4 (four) times daily as needed for diarrhea or loose stools. 10/18/17   Geniene List A, PA-C  norethindrone-ethinyl estradiol-iron (LOESTRIN FE 1.5/30) 1.5-30 MG-MCG tablet Take 1 tablet by mouth daily.    [provider]  ondansetron (ZOFRAN) 4 MG tablet Take 1 tablet (4 mg total) by mouth every 8 (eight) hours as needed for nausea or vomiting. Patient not taking: Reported on 10/10/2017 09/16/14   Cornett, Marcello Moores, MD  XARELTO 15 MG TABS tablet Take 15 mg by mouth See admin instructions. Order date: take one tablet (15 mg) by mouth daily for 21 days. 10/11/17   [provider]  XARELTO 20 MG TABS tablet Take 20 mg by mouth  See admin instructions. Order date 10/11/17: take 1 tablet (20 mg) by mouth daily for 10 days (with food) 10/11/17   [provider]    Family History No family history on file.  Social History Social History   Tobacco Use  . Smoking status: Never Smoker  . Smokeless tobacco: Never Used  Substance Use Topics  . Alcohol use: Yes    Comment: Once a month.   . Drug use: No     Allergies   Other   Review of Systems Review of Systems  Constitutional: Negative for activity  change, chills and fever.  Respiratory: Negative for shortness of breath and wheezing.   Cardiovascular: Negative for chest pain.  Gastrointestinal: Positive for abdominal pain and diarrhea (loose stools). Negative for constipation, nausea, rectal pain and vomiting.       No melena or hematochezia  Genitourinary: Negative for dysuria.  Musculoskeletal: Negative for back pain.  Skin: Negative for rash.  Allergic/Immunologic: Negative for immunocompromised state.  Neurological: Negative for dizziness, weakness, light-headedness, numbness and headaches.  Psychiatric/Behavioral: Negative for confusion.   Physical Exam Updated Vital Signs BP 129/78 (BP Location: Right Arm)   Pulse 75   Temp 98 F (36.7 C) (Oral)   Resp 18   Ht 5\' 1"  (1.549 m)   Wt 68 kg   SpO2 100%   BMI 28.34 kg/m   Physical Exam  Constitutional: No distress.  HENT:  Head: Normocephalic.  Eyes: Conjunctivae are normal.  Neck: Normal range of motion. Neck supple.  Cardiovascular: Normal rate, regular rhythm, normal heart sounds and intact distal pulses. Exam reveals no gallop and no friction rub.  No murmur heard. Pulmonary/Chest: Effort normal. No stridor. No respiratory distress. She has no wheezes. She has no rales. She exhibits no tenderness.  Abdominal: Soft. Bowel sounds are normal. She exhibits no distension and no mass. There is tenderness. There is no rebound and no guarding. No hernia.  Abdomen is soft, nondistended.  Moderately tender to palpation in the bilateral lower quadrants without rebound or guarding.  No tenderness over McBurney's point.  Negative Murphy sign.  No CVA tenderness bilaterally.  No peritoneal signs.  Musculoskeletal: Normal range of motion. She exhibits no edema, tenderness or deformity.  Neurological: She is alert.  Skin: Skin is warm. No rash noted.  Psychiatric: Her behavior is normal.  Nursing note and vitals reviewed.    ED Treatments / Results  Labs (all labs ordered are  listed, but only abnormal results are displayed) Labs Reviewed  CBC - Abnormal; Notable for the following components:      Result Value   Hemoglobin 15.8 (*)    HCT 48.9 (*)    All other components within normal limits  URINALYSIS, ROUTINE W REFLEX MICROSCOPIC - Abnormal; Notable for the following components:   Hgb urine dipstick LARGE (*)    Bacteria, UA RARE (*)    All other components within normal limits  LIPASE, BLOOD  COMPREHENSIVE METABOLIC PANEL  I-STAT BETA HCG BLOOD, ED ([facility], [facility], AP ONLY)  I-STAT CG4 LACTIC ACID, ED    EKG None  Radiology US Transvaginal Non-ob  Result Date: 10/18/2017 CLINICAL DATA:  Bilateral pelvic pain EXAM: TRANSABDOMINAL AND TRANSVAGINAL ULTRASOUND OF PELVIS TECHNIQUE: Both transabdominal and transvaginal ultrasound examinations of the pelvis were performed. Transabdominal technique was performed for global imaging of the pelvis including uterus, ovaries, adnexal regions, and pelvic cul-de-sac. It was necessary to proceed with endovaginal exam following the transabdominal exam to visualize the endometrium and  ovaries. COMPARISON:  None FINDINGS: Uterus Measurements: 7.9 x 3.9 x 4.9 cm. No fibroids or other mass visualized. Endometrium Thickness: 5.9 mm.  No focal abnormality visualized. Right ovary Measurements: 2.3 x 1.9 x 1.7 cm. Normal appearance/no adnexal mass. Left ovary Measurements: 2.6 x 1.6 x 1.9 cm. Normal appearance/no adnexal mass. Other findings No abnormal free fluid. IMPRESSION: 1. Normal pelvic ultrasound. Electronically Signed   By: Kathreen Devoid   On: 10/18/2017 15:25   US Pelvis Complete  Result Date: 10/18/2017 CLINICAL DATA:  Bilateral pelvic pain EXAM: TRANSABDOMINAL AND TRANSVAGINAL ULTRASOUND OF PELVIS TECHNIQUE: Both transabdominal and transvaginal ultrasound examinations of the pelvis were performed. Transabdominal technique was performed for global imaging of the pelvis including uterus, ovaries, adnexal regions, and pelvic  cul-de-sac. It was necessary to proceed with endovaginal exam following the transabdominal exam to visualize the endometrium and ovaries. COMPARISON:  None FINDINGS: Uterus Measurements: 7.9 x 3.9 x 4.9 cm. No fibroids or other mass visualized. Endometrium Thickness: 5.9 mm.  No focal abnormality visualized. Right ovary Measurements: 2.3 x 1.9 x 1.7 cm. Normal appearance/no adnexal mass. Left ovary Measurements: 2.6 x 1.6 x 1.9 cm. Normal appearance/no adnexal mass. Other findings No abnormal free fluid. IMPRESSION: 1. Normal pelvic ultrasound. Electronically Signed   By: Kathreen Devoid   On: 10/18/2017 15:25    Procedures Procedures (including critical care time)  Medications Ordered in ED Medications - No data to display   Initial Impression / Assessment and Plan / ED Course  I have reviewed the triage vital signs and the nursing notes.  Pertinent labs & imaging results that were available during my care of the patient were reviewed by me and considered in my medical decision making (see chart for details).     47 year old female with a history of DVT on Xarelto, GERD, and hypothyroidism presenting from UC with bilateral lower pelvic pain, characterizes pressure, for 2 days with nonbloody loose stools.  She was diagnosed with a DVT of the left calf on 10/10/2017 and has been compliant with her home Xarelto.  On exam, she has moderately tender to palpation in the bilateral lower quadrants without rebound or guarding.  The patient was also seen and evaluated by Dr. Lacinda Axon, attending physician.  The patient has no leukocytosis.  Metabolic panel is unremarkable.  Lactate is normal.  There is a mild elevation of the patient's hemoglobin and hematocrit, likely secondary to loose stools over the last few days.  She denies hematochezia and melena.  She has had no GU symptoms over the last few days.  She was previously on OCPs for many years for ovarian cyst, which was discontinued last week.  She does report  that she was having some abdominal cramping earlier this week, which has since resolved.  DVT study of the lower extremities repeated, which demonstrated improvement and left lower extremity DVT.  Pelvic ultrasound is negative for ovarian cyst, uterine fibroids, or other acute pathology.  Urine culture sent.  Discussed these findings with the patient and her significant other.  The patient reports that the pelvic ultrasound was exquisitely uncomfortable.  She reports that she has been having perimenopausal symptoms within the last year.  Denies dyspareunia and vaginismus.  Will discharge the patient with symptomatic treatment including Imodium for loose stools.  Doubt colitis, appendicitis, pyelonephritis, infectious enterocolitis, gastroenteritis, vaginitis, or worsening DVT.  She has a follow-up appointment with her PCP in 2 days.  Strict return precautions given.  She is hemodynamically stable and in no  acute distress.  She is safe for discharge home with outpatient follow-up at this time.  Final Clinical Impressions(s) / ED Diagnoses   Final diagnoses:  Pelvic pain in female    ED Discharge Orders         Ordered    loperamide (IMODIUM) 2 MG capsule  4 times daily PRN     10/18/17 1659           Cuyler Vandyken A, PA-C 10/18/17 1727    Nat Christen, MD 10/19/17 1631    Nat Christen, MD 10/19/17 [phone]

## 2017-10-18 NOTE — ED Notes (Signed)
Patient given discharge instructions and verbalized understanding.  Patient stable to discharge at this time.  Patient is alert and oriented to baseline.  No distressed noted at this time.  All belongings taken with the patient at discharge.   

## 2017-10-18 NOTE — Progress Notes (Signed)
Bilateral lower extremity venous duplex completed - Preliminary results - Right no evidence of DVT or Baker's cyst. Left - Positive for DVT of the calf vein gastrocnemius which was noted in exam of 10/11/2017. There has been no propagation and there appears to now be some flow noted. There is no evidence of a Baker's cyst. Toma Copier, RVS 10/18/2017, 4:19 PM

## 2017-10-18 NOTE — ED Triage Notes (Signed)
Pt was seen X 1 week ago was found to have DVT, is on Xarelto Pt states she went on blood thinner, she stopped her birth control , has not had a period since. Seen at [facility] walk in clinic today for RLQ LLQ pain with guarding on exam. Pt also having diarrhea, denies any black or bloody diarrhea.

## 2017-10-18 NOTE — Discharge Instructions (Addendum)
Thank you for allowing me to care for you today in the Emergency Department.   Your abdominal and pelvic ultrasound did not show any ovarian cyst or uterine fibroids.  The DVT study of your lower leg shows improvement of the blood clot since you have been taking Xarelto.  For the pelvic discomfort, you can take thousand milligrams of Tylenol once every 8 hours to help with pain.  You can also try applying heat for 15 to 20 minutes to see if this improves the pain and pressure.  Take 1 tablet of Imodium every 6 hours as needed for diarrhea or loose stools.  Your symptoms may be due to the recent change where your birth control pills were stopped.  You can follow-up with your OB/GYN or your primary care provider if your symptoms do not improve in the next few days.  Return to the emergency department if you develop vomiting, vomiting with blood, dark black or blood in your stools, high fever or chills, or other new, concerning symptoms.

## 2017-10-18 NOTE — ED Notes (Signed)
Patient transported to Ultrasound 

## 2017-10-20 DIAGNOSIS — R102 Pelvic and perineal pain: Secondary | ICD-10-CM | POA: Diagnosis not present

## 2017-10-20 DIAGNOSIS — I82402 Acute embolism and thrombosis of unspecified deep veins of left lower extremity: Secondary | ICD-10-CM | POA: Diagnosis not present

## 2017-10-20 DIAGNOSIS — R51 Headache: Secondary | ICD-10-CM | POA: Diagnosis not present

## 2017-10-21 DIAGNOSIS — R102 Pelvic and perineal pain: Secondary | ICD-10-CM | POA: Diagnosis not present

## 2017-10-24 DIAGNOSIS — D2371 Other benign neoplasm of skin of right lower limb, including hip: Secondary | ICD-10-CM | POA: Diagnosis not present

## 2017-10-24 DIAGNOSIS — M79671 Pain in right foot: Secondary | ICD-10-CM | POA: Diagnosis not present

## 2017-10-24 DIAGNOSIS — I82409 Acute embolism and thrombosis of unspecified deep veins of unspecified lower extremity: Secondary | ICD-10-CM | POA: Diagnosis not present

## 2017-11-04 DIAGNOSIS — M79671 Pain in right foot: Secondary | ICD-10-CM | POA: Diagnosis not present

## 2017-11-04 DIAGNOSIS — D2371 Other benign neoplasm of skin of right lower limb, including hip: Secondary | ICD-10-CM | POA: Diagnosis not present

## 2017-11-06 DIAGNOSIS — D2371 Other benign neoplasm of skin of right lower limb, including hip: Secondary | ICD-10-CM | POA: Diagnosis not present

## 2017-11-13 DIAGNOSIS — D2371 Other benign neoplasm of skin of right lower limb, including hip: Secondary | ICD-10-CM | POA: Diagnosis not present

## 2017-11-20 DIAGNOSIS — D2371 Other benign neoplasm of skin of right lower limb, including hip: Secondary | ICD-10-CM | POA: Diagnosis not present

## 2017-11-27 DIAGNOSIS — D2371 Other benign neoplasm of skin of right lower limb, including hip: Secondary | ICD-10-CM | POA: Diagnosis not present

## 2017-12-04 DIAGNOSIS — D2371 Other benign neoplasm of skin of right lower limb, including hip: Secondary | ICD-10-CM | POA: Diagnosis not present

## 2017-12-11 DIAGNOSIS — D2371 Other benign neoplasm of skin of right lower limb, including hip: Secondary | ICD-10-CM | POA: Diagnosis not present

## 2017-12-18 DIAGNOSIS — D2371 Other benign neoplasm of skin of right lower limb, including hip: Secondary | ICD-10-CM | POA: Diagnosis not present

## 2017-12-22 DIAGNOSIS — N938 Other specified abnormal uterine and vaginal bleeding: Secondary | ICD-10-CM | POA: Diagnosis not present

## 2017-12-22 DIAGNOSIS — D509 Iron deficiency anemia, unspecified: Secondary | ICD-10-CM | POA: Diagnosis not present

## 2017-12-25 DIAGNOSIS — D2371 Other benign neoplasm of skin of right lower limb, including hip: Secondary | ICD-10-CM | POA: Diagnosis not present

## 2018-01-09 DIAGNOSIS — Z86718 Personal history of other venous thrombosis and embolism: Secondary | ICD-10-CM | POA: Diagnosis not present

## 2018-01-09 DIAGNOSIS — N921 Excessive and frequent menstruation with irregular cycle: Secondary | ICD-10-CM | POA: Diagnosis not present

## 2018-01-09 DIAGNOSIS — Z23 Encounter for immunization: Secondary | ICD-10-CM | POA: Diagnosis not present

## 2018-03-20 DIAGNOSIS — E039 Hypothyroidism, unspecified: Secondary | ICD-10-CM | POA: Diagnosis not present

## 2018-04-06 DIAGNOSIS — Z6824 Body mass index (BMI) 24.0-24.9, adult: Secondary | ICD-10-CM | POA: Diagnosis not present

## 2018-04-06 DIAGNOSIS — Z1231 Encounter for screening mammogram for malignant neoplasm of breast: Secondary | ICD-10-CM | POA: Diagnosis not present

## 2018-04-06 DIAGNOSIS — Z01419 Encounter for gynecological examination (general) (routine) without abnormal findings: Secondary | ICD-10-CM | POA: Diagnosis not present

## 2018-05-04 DIAGNOSIS — E039 Hypothyroidism, unspecified: Secondary | ICD-10-CM | POA: Diagnosis not present

## 2018-08-18 DIAGNOSIS — Z20828 Contact with and (suspected) exposure to other viral communicable diseases: Secondary | ICD-10-CM | POA: Diagnosis not present

## 2018-09-24 DIAGNOSIS — L237 Allergic contact dermatitis due to plants, except food: Secondary | ICD-10-CM | POA: Diagnosis not present

## 2018-11-04 DIAGNOSIS — E039 Hypothyroidism, unspecified: Secondary | ICD-10-CM | POA: Diagnosis not present

## 2018-11-05 DIAGNOSIS — Z20828 Contact with and (suspected) exposure to other viral communicable diseases: Secondary | ICD-10-CM | POA: Diagnosis not present

## 2018-11-25 DIAGNOSIS — H5213 Myopia, bilateral: Secondary | ICD-10-CM | POA: Diagnosis not present

## 2018-12-11 DIAGNOSIS — E039 Hypothyroidism, unspecified: Secondary | ICD-10-CM | POA: Diagnosis not present

## 2019-02-12 DIAGNOSIS — Z03818 Encounter for observation for suspected exposure to other biological agents ruled out: Secondary | ICD-10-CM | POA: Diagnosis not present

## 2019-04-26 DIAGNOSIS — Z1231 Encounter for screening mammogram for malignant neoplasm of breast: Secondary | ICD-10-CM | POA: Diagnosis not present

## 2019-04-27 ENCOUNTER — Other Ambulatory Visit: Payer: Self-pay | Admitting: Obstetrics & Gynecology

## 2019-04-27 DIAGNOSIS — R928 Other abnormal and inconclusive findings on diagnostic imaging of breast: Secondary | ICD-10-CM

## 2019-05-03 ENCOUNTER — Ambulatory Visit
Admission: RE | Admit: 2019-05-03 | Discharge: 2019-05-03 | Disposition: A | Discharge status: Home / Self Care | Payer: 59 | Source: Ambulatory Visit | Attending: Obstetrics & Gynecology | Admitting: Obstetrics & Gynecology

## 2019-05-03 ENCOUNTER — Other Ambulatory Visit: Payer: Self-pay

## 2019-05-03 DIAGNOSIS — N6011 Diffuse cystic mastopathy of right breast: Secondary | ICD-10-CM | POA: Diagnosis not present

## 2019-05-03 DIAGNOSIS — R928 Other abnormal and inconclusive findings on diagnostic imaging of breast: Secondary | ICD-10-CM

## 2019-05-03 DIAGNOSIS — R922 Inconclusive mammogram: Secondary | ICD-10-CM | POA: Diagnosis not present

## 2019-05-06 DIAGNOSIS — Z6825 Body mass index (BMI) 25.0-25.9, adult: Secondary | ICD-10-CM | POA: Diagnosis not present

## 2019-05-06 DIAGNOSIS — Z01419 Encounter for gynecological examination (general) (routine) without abnormal findings: Secondary | ICD-10-CM | POA: Diagnosis not present

## 2019-05-07 ENCOUNTER — Other Ambulatory Visit: Payer: Self-pay | Admitting: Obstetrics & Gynecology

## 2019-05-07 DIAGNOSIS — N6002 Solitary cyst of left breast: Secondary | ICD-10-CM

## 2019-05-26 DIAGNOSIS — U071 COVID-19: Secondary | ICD-10-CM | POA: Diagnosis not present

## 2019-05-26 DIAGNOSIS — Z20828 Contact with and (suspected) exposure to other viral communicable diseases: Secondary | ICD-10-CM | POA: Diagnosis not present

## 2019-06-04 ENCOUNTER — Other Ambulatory Visit: Payer: 59

## 2019-06-10 ENCOUNTER — Other Ambulatory Visit: Payer: Self-pay

## 2019-06-10 ENCOUNTER — Ambulatory Visit
Admission: RE | Admit: 2019-06-10 | Discharge: 2019-06-10 | Disposition: A | Discharge status: Home / Self Care | Payer: BC Managed Care – PPO | Source: Ambulatory Visit | Attending: Obstetrics & Gynecology | Admitting: Obstetrics & Gynecology

## 2019-06-10 DIAGNOSIS — N6002 Solitary cyst of left breast: Secondary | ICD-10-CM

## 2019-06-10 DIAGNOSIS — R922 Inconclusive mammogram: Secondary | ICD-10-CM | POA: Diagnosis not present

## 2019-06-10 DIAGNOSIS — N6012 Diffuse cystic mastopathy of left breast: Secondary | ICD-10-CM | POA: Diagnosis not present

## 2019-07-12 DIAGNOSIS — D649 Anemia, unspecified: Secondary | ICD-10-CM | POA: Diagnosis not present

## 2019-07-12 DIAGNOSIS — Z0000 Encounter for general adult medical examination without abnormal findings: Secondary | ICD-10-CM | POA: Diagnosis not present

## 2019-07-12 DIAGNOSIS — E039 Hypothyroidism, unspecified: Secondary | ICD-10-CM | POA: Diagnosis not present

## 2019-07-12 DIAGNOSIS — Z13220 Encounter for screening for lipoid disorders: Secondary | ICD-10-CM | POA: Diagnosis not present

## 2019-07-16 DIAGNOSIS — Z0000 Encounter for general adult medical examination without abnormal findings: Secondary | ICD-10-CM | POA: Diagnosis not present

## 2019-08-20 DIAGNOSIS — E039 Hypothyroidism, unspecified: Secondary | ICD-10-CM | POA: Diagnosis not present

## 2019-08-20 DIAGNOSIS — N289 Disorder of kidney and ureter, unspecified: Secondary | ICD-10-CM | POA: Diagnosis not present

## 2019-08-25 DIAGNOSIS — Z03818 Encounter for observation for suspected exposure to other biological agents ruled out: Secondary | ICD-10-CM | POA: Diagnosis not present

## 2019-08-25 DIAGNOSIS — Z20822 Contact with and (suspected) exposure to covid-19: Secondary | ICD-10-CM | POA: Diagnosis not present

## 2019-09-09 DIAGNOSIS — R05 Cough: Secondary | ICD-10-CM | POA: Diagnosis not present

## 2019-10-06 DIAGNOSIS — E039 Hypothyroidism, unspecified: Secondary | ICD-10-CM | POA: Diagnosis not present

## 2019-11-26 DIAGNOSIS — H5213 Myopia, bilateral: Secondary | ICD-10-CM | POA: Diagnosis not present

## 2020-03-01 DIAGNOSIS — R102 Pelvic and perineal pain: Secondary | ICD-10-CM | POA: Diagnosis not present

## 2020-03-01 DIAGNOSIS — R3129 Other microscopic hematuria: Secondary | ICD-10-CM | POA: Insufficient documentation

## 2020-03-13 DIAGNOSIS — Z20828 Contact with and (suspected) exposure to other viral communicable diseases: Secondary | ICD-10-CM | POA: Diagnosis not present

## 2020-04-18 ENCOUNTER — Other Ambulatory Visit: Payer: BC Managed Care – PPO

## 2020-04-18 DIAGNOSIS — Z20822 Contact with and (suspected) exposure to covid-19: Secondary | ICD-10-CM | POA: Diagnosis not present

## 2020-04-19 LAB — NOVEL CORONAVIRUS, NAA: SARS-CoV-2, NAA: NOT DETECTED

## 2020-04-19 LAB — SARS-COV-2, NAA 2 DAY TAT

## 2020-05-31 DIAGNOSIS — Z1231 Encounter for screening mammogram for malignant neoplasm of breast: Secondary | ICD-10-CM | POA: Diagnosis not present

## 2020-06-07 DIAGNOSIS — E039 Hypothyroidism, unspecified: Secondary | ICD-10-CM | POA: Diagnosis not present

## 2020-06-18 DIAGNOSIS — R051 Acute cough: Secondary | ICD-10-CM | POA: Diagnosis not present

## 2020-06-18 DIAGNOSIS — J069 Acute upper respiratory infection, unspecified: Secondary | ICD-10-CM | POA: Diagnosis not present

## 2020-06-20 DIAGNOSIS — Z6824 Body mass index (BMI) 24.0-24.9, adult: Secondary | ICD-10-CM | POA: Diagnosis not present

## 2020-06-20 DIAGNOSIS — Z01419 Encounter for gynecological examination (general) (routine) without abnormal findings: Secondary | ICD-10-CM | POA: Diagnosis not present

## 2020-07-17 DIAGNOSIS — E039 Hypothyroidism, unspecified: Secondary | ICD-10-CM | POA: Diagnosis not present

## 2020-07-17 DIAGNOSIS — E162 Hypoglycemia, unspecified: Secondary | ICD-10-CM | POA: Diagnosis not present

## 2020-07-17 DIAGNOSIS — G2581 Restless legs syndrome: Secondary | ICD-10-CM | POA: Diagnosis not present

## 2020-07-17 DIAGNOSIS — N920 Excessive and frequent menstruation with regular cycle: Secondary | ICD-10-CM | POA: Diagnosis not present

## 2020-07-27 DIAGNOSIS — H6693 Otitis media, unspecified, bilateral: Secondary | ICD-10-CM | POA: Diagnosis not present

## 2020-07-27 DIAGNOSIS — J069 Acute upper respiratory infection, unspecified: Secondary | ICD-10-CM | POA: Diagnosis not present

## 2020-10-02 DIAGNOSIS — R0789 Other chest pain: Secondary | ICD-10-CM | POA: Diagnosis not present

## 2020-10-03 ENCOUNTER — Other Ambulatory Visit: Payer: Self-pay

## 2020-10-03 ENCOUNTER — Other Ambulatory Visit: Payer: Self-pay | Admitting: Family Medicine

## 2020-10-03 ENCOUNTER — Ambulatory Visit
Admission: RE | Admit: 2020-10-03 | Discharge: 2020-10-03 | Disposition: A | Discharge status: Home / Self Care | Payer: BC Managed Care – PPO | Source: Ambulatory Visit | Attending: Family Medicine | Admitting: Family Medicine

## 2020-10-03 DIAGNOSIS — R079 Chest pain, unspecified: Secondary | ICD-10-CM

## 2020-10-03 DIAGNOSIS — K7689 Other specified diseases of liver: Secondary | ICD-10-CM | POA: Diagnosis not present

## 2020-10-03 DIAGNOSIS — N289 Disorder of kidney and ureter, unspecified: Secondary | ICD-10-CM | POA: Diagnosis not present

## 2020-10-03 DIAGNOSIS — R7989 Other specified abnormal findings of blood chemistry: Secondary | ICD-10-CM

## 2020-10-03 DIAGNOSIS — R918 Other nonspecific abnormal finding of lung field: Secondary | ICD-10-CM | POA: Diagnosis not present

## 2020-10-03 DIAGNOSIS — J9811 Atelectasis: Secondary | ICD-10-CM | POA: Diagnosis not present

## 2020-10-03 MED ORDER — IOPAMIDOL (ISOVUE-370) INJECTION 76%
75.0000 mL | Freq: Once | INTRAVENOUS | Status: AC | PRN
  Administered 2020-10-03: 75 mL via INTRAVENOUS

## 2020-12-05 DIAGNOSIS — R0981 Nasal congestion: Secondary | ICD-10-CM | POA: Diagnosis not present

## 2020-12-05 DIAGNOSIS — J101 Influenza due to other identified influenza virus with other respiratory manifestations: Secondary | ICD-10-CM | POA: Diagnosis not present

## 2020-12-05 DIAGNOSIS — R509 Fever, unspecified: Secondary | ICD-10-CM | POA: Diagnosis not present

## 2020-12-05 DIAGNOSIS — R059 Cough, unspecified: Secondary | ICD-10-CM | POA: Diagnosis not present

## 2020-12-05 DIAGNOSIS — Z03818 Encounter for observation for suspected exposure to other biological agents ruled out: Secondary | ICD-10-CM | POA: Diagnosis not present

## 2021-01-17 DIAGNOSIS — N920 Excessive and frequent menstruation with regular cycle: Secondary | ICD-10-CM | POA: Diagnosis not present

## 2021-01-17 DIAGNOSIS — G4700 Insomnia, unspecified: Secondary | ICD-10-CM | POA: Diagnosis not present

## 2021-01-17 DIAGNOSIS — E039 Hypothyroidism, unspecified: Secondary | ICD-10-CM | POA: Diagnosis not present

## 2021-01-17 DIAGNOSIS — Z1159 Encounter for screening for other viral diseases: Secondary | ICD-10-CM | POA: Diagnosis not present

## 2021-01-17 DIAGNOSIS — Z13220 Encounter for screening for lipoid disorders: Secondary | ICD-10-CM | POA: Diagnosis not present

## 2021-01-29 DIAGNOSIS — H5213 Myopia, bilateral: Secondary | ICD-10-CM | POA: Diagnosis not present

## 2021-01-29 DIAGNOSIS — H53143 Visual discomfort, bilateral: Secondary | ICD-10-CM | POA: Diagnosis not present

## 2021-03-23 DIAGNOSIS — E039 Hypothyroidism, unspecified: Secondary | ICD-10-CM | POA: Diagnosis not present

## 2021-05-07 ENCOUNTER — Other Ambulatory Visit: Payer: Self-pay | Admitting: Obstetrics and Gynecology

## 2021-05-07 DIAGNOSIS — Z1231 Encounter for screening mammogram for malignant neoplasm of breast: Secondary | ICD-10-CM

## 2021-06-01 ENCOUNTER — Ambulatory Visit
Admission: RE | Admit: 2021-06-01 | Discharge: 2021-06-01 | Disposition: A | Discharge status: Home / Self Care | Payer: BC Managed Care – PPO | Source: Ambulatory Visit | Attending: Obstetrics and Gynecology | Admitting: Obstetrics and Gynecology

## 2021-06-01 DIAGNOSIS — Z1231 Encounter for screening mammogram for malignant neoplasm of breast: Secondary | ICD-10-CM

## 2021-06-01 HISTORY — DX: Diffuse cystic mastopathy of unspecified breast: N60.19

## 2021-06-22 ENCOUNTER — Encounter: Payer: 59 | Admitting: Obstetrics and Gynecology

## 2021-06-27 ENCOUNTER — Other Ambulatory Visit (HOSPITAL_COMMUNITY)
Admission: RE | Admit: 2021-06-27 | Discharge: 2021-06-27 | Disposition: A | Discharge status: Home / Self Care | Payer: BC Managed Care – PPO | Source: Ambulatory Visit | Attending: Obstetrics and Gynecology | Admitting: Obstetrics and Gynecology

## 2021-06-27 ENCOUNTER — Encounter: Payer: Self-pay | Admitting: Obstetrics and Gynecology

## 2021-06-27 ENCOUNTER — Ambulatory Visit: Payer: BC Managed Care – PPO | Admitting: Obstetrics and Gynecology

## 2021-06-27 VITALS — BP 110/66 | HR 66 | Ht 61.0 in | Wt 135.0 lb

## 2021-06-27 DIAGNOSIS — R87610 Atypical squamous cells of undetermined significance on cytologic smear of cervix (ASC-US): Secondary | ICD-10-CM | POA: Insufficient documentation

## 2021-06-27 DIAGNOSIS — G4700 Insomnia, unspecified: Secondary | ICD-10-CM | POA: Insufficient documentation

## 2021-06-27 DIAGNOSIS — Z01419 Encounter for gynecological examination (general) (routine) without abnormal findings: Secondary | ICD-10-CM

## 2021-06-27 DIAGNOSIS — Z124 Encounter for screening for malignant neoplasm of cervix: Secondary | ICD-10-CM | POA: Insufficient documentation

## 2021-06-27 DIAGNOSIS — I82409 Acute embolism and thrombosis of unspecified deep veins of unspecified lower extremity: Secondary | ICD-10-CM | POA: Insufficient documentation

## 2021-06-27 DIAGNOSIS — E162 Hypoglycemia, unspecified: Secondary | ICD-10-CM | POA: Insufficient documentation

## 2021-06-27 DIAGNOSIS — N920 Excessive and frequent menstruation with regular cycle: Secondary | ICD-10-CM

## 2021-06-27 DIAGNOSIS — G44209 Tension-type headache, unspecified, not intractable: Secondary | ICD-10-CM | POA: Insufficient documentation

## 2021-06-27 DIAGNOSIS — G2581 Restless legs syndrome: Secondary | ICD-10-CM | POA: Insufficient documentation

## 2021-06-27 DIAGNOSIS — Z86718 Personal history of other venous thrombosis and embolism: Secondary | ICD-10-CM | POA: Diagnosis not present

## 2021-06-27 DIAGNOSIS — Z862 Personal history of diseases of the blood and blood-forming organs and certain disorders involving the immune mechanism: Secondary | ICD-10-CM | POA: Insufficient documentation

## 2021-06-27 DIAGNOSIS — Z800 Family history of malignant neoplasm of digestive organs: Secondary | ICD-10-CM | POA: Insufficient documentation

## 2021-06-27 NOTE — Patient Instructions (Signed)

## 2021-06-27 NOTE — Progress Notes (Signed)
51 y.o. [id number]. Married White or Caucasian Not Hispanic or Latino female here for annual exam.   ?In 2019 she developed a DVT while on OCP's after a long flight. ?Prior provider had trouble putting an IUD in, unable to put it in with u/s guidance.  ?She was told she had adenomyosis.  ?Cycles are monthly, one skipped cycle a few months ago. On her heavy day she can saturate an ultra tampon in 2 hours. She is taking Aleve for 2 days of her cycle.  ?Period Cycle (Days): 28 ?Period Duration (Days): 3-4 ?Period Pattern: Regular ?Menstrual Flow: Heavy ?Menstrual Control: Tampon (ultra tampon) ?Menstrual Control Change Freq (Hours): 2-3 ?Dysmenorrhea: (!) Mild ?Dysmenorrhea Symptoms: Cramping ?Gets labs with her primary. Last ~4-5 months ago. Not aware of being anemic recently.  ? ?Sexually active, no pain.  ? ?H/o C/S x 3 last with tubal ligation. ?At 19 she had a laparotomy with ovarian cystectomy.  ? ?She dribbles a little after she voids, no other real issues with leakage.  ? ?Patient's last menstrual period was 06/21/2021.          ?Sexually active: Yes.    ?The current method of family planning is tubal ligation.    ?Exercising: Yes.     Walking  ?Smoker:  no ? ?Health Maintenance: ?Pap:  [facility]  ?History of abnormal Pap:  yes, No precancerous changes.  ?MMG:  06/01/21 Bi-rads 1 neg  ?BMD:   none  ?Colonoscopy: 5 years ago normal, followed by primary ?TDaP:  08/24/15  ?Gardasil: none  ? ? reports that she has never smoked. She has never used smokeless tobacco. She reports current alcohol use. She reports that she does not use drugs. 1-2 drinks a week. 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 Kids are 22, 20 and almost 18. Older 2 are boys, youngest is a girl.  ? ?Past Medical History:  ?Diagnosis Date  ? DVT (deep venous thrombosis) ([place])   ? Fibrocystic breast   ? Gallstones   ? GERD (gastroesophageal reflux disease)   ? Hypothyroid   ? ? ?Past Surgical History:  ?Procedure  Laterality Date  ? CESAREAN SECTION    ? x3  ? CHOLECYSTECTOMY N/A 09/16/2014  ? Procedure: LAPAROSCOPIC CHOLECYSTECTOMY WITH INTRAOPERATIVE CHOLANGIOGRAM;  Surgeon: Erroll Luna, MD;  Location: [facility];  Service: General;  Laterality: N/A;  ? OVARIAN CYST SURGERY    ? ? ?Current Outpatient Medications  ?Medication Sig Dispense Refill  ? levothyroxine (SYNTHROID) 75 MCG tablet (d.a.w-1) 1 tablet in the morning on an empty stomach    ? ?No current facility-administered medications for this visit.  ? ? ?Family History  ?Problem Relation Age of Onset  ? Breast cancer Neg Hx   ? ? ?Review of Systems  ?All other systems reviewed and are negative. ? ?Exam:   ?BP 110/66   Pulse 66   Ht '5\' 1"'$  (1.549 m)   Wt 135 lb (61.2 kg)   LMP 06/21/2021   SpO2 99%   BMI 25.51 kg/m?   Weight change: '@WEIGHTCHANGE'$ @ Height:   Height: '5\' 1"'$  (154.9 cm)  ?Ht Readings from Last 3 Encounters:  ?06/27/21 '5\' 1"'$  (1.549 m)  ?10/18/17 '5\' 1"'$  (1.549 m)  ?10/10/17 '5\' 1"'$  (1.549 m)  ? ? ?General appearance: alert, cooperative and appears stated age ?Head: Normocephalic, without obvious abnormality, atraumatic ?Neck: no adenopathy, supple, symmetrical, trachea midline and thyroid normal to inspection and palpation ?Lungs: clear to auscultation  bilaterally ?Cardiovascular: regular rate and rhythm ?Breasts: normal appearance, no masses or tenderness ?Abdomen: soft, non-tender; non distended,  no masses,  no organomegaly ?Extremities: extremities normal, atraumatic, no cyanosis or edema ?Skin: Skin color, texture, turgor normal. No rashes or lesions ?Lymph nodes: Cervical, supraclavicular, and axillary nodes normal. ?No abnormal inguinal nodes palpated ?Neurologic: Grossly normal ? ? ?Pelvic: External genitalia:  no lesions ?             Urethra:  normal appearing urethra with no masses, tenderness or lesions ?             Bartholins and Skenes: normal    ?             Vagina: normal appearing vagina with normal color and discharge,  no lesions ?             Cervix: no lesions ?              ?Bimanual Exam:  Uterus:  normal size, contour, position, consistency, mobility, non-tender and anteverted ?             Adnexa: no mass, fullness, tenderness ?              Rectovaginal: Confirms ?              Anus:  normal sphincter tone, no lesions ? ?Gae Dry chaperoned for the exam. ? ?1. Well woman exam ?Discussed breast self exam ?Discussed calcium and vit D intake ?Mammogram UTD ?Colonoscopy through her primary ?Labs with primary ? ?2. Menorrhagia with regular cycle ?Will get a copy of her lab work from her primary ?Want to see if she is anemic ?Use Aleve with her cycles (declines prescription strength) ?Previously her GYN was unable to insert an IUD with ultrasound guidance. Discussed trying cytotec and repeating attempt with U/S guidance if she desires.  ? ?3. History of DVT (deep vein thrombosis) ?On OCP's after a long flight.  ? ?4. Screening for cervical cancer ?- Cytology - PAP ? ? ?

## 2021-06-29 LAB — CYTOLOGY - PAP
Comment: NEGATIVE
Diagnosis: NEGATIVE
Diagnosis: REACTIVE
High risk HPV: NEGATIVE

## 2021-07-11 ENCOUNTER — Telehealth: Payer: Self-pay | Admitting: Obstetrics and Gynecology

## 2021-07-11 NOTE — Telephone Encounter (Signed)
Mychart message sent.

## 2021-09-04 DIAGNOSIS — L309 Dermatitis, unspecified: Secondary | ICD-10-CM | POA: Diagnosis not present

## 2021-09-08 DIAGNOSIS — G4733 Obstructive sleep apnea (adult) (pediatric): Secondary | ICD-10-CM

## 2021-09-08 HISTORY — DX: Obstructive sleep apnea (adult) (pediatric): G47.33

## 2021-09-14 DIAGNOSIS — R42 Dizziness and giddiness: Secondary | ICD-10-CM | POA: Diagnosis not present

## 2021-09-14 DIAGNOSIS — I951 Orthostatic hypotension: Secondary | ICD-10-CM | POA: Diagnosis not present

## 2021-09-17 DIAGNOSIS — E039 Hypothyroidism, unspecified: Secondary | ICD-10-CM | POA: Diagnosis not present

## 2021-09-23 ENCOUNTER — Emergency Department (HOSPITAL_BASED_OUTPATIENT_CLINIC_OR_DEPARTMENT_OTHER)
Admission: EM | Admit: 2021-09-23 | Discharge: 2021-09-23 | Disposition: A | Discharge status: Home / Self Care | Payer: BC Managed Care – PPO | Attending: Emergency Medicine | Admitting: Emergency Medicine

## 2021-09-23 ENCOUNTER — Other Ambulatory Visit: Payer: Self-pay

## 2021-09-23 ENCOUNTER — Encounter (HOSPITAL_BASED_OUTPATIENT_CLINIC_OR_DEPARTMENT_OTHER): Payer: Self-pay | Admitting: Emergency Medicine

## 2021-09-23 DIAGNOSIS — R030 Elevated blood-pressure reading, without diagnosis of hypertension: Secondary | ICD-10-CM | POA: Insufficient documentation

## 2021-09-23 DIAGNOSIS — R531 Weakness: Secondary | ICD-10-CM | POA: Insufficient documentation

## 2021-09-23 DIAGNOSIS — R42 Dizziness and giddiness: Secondary | ICD-10-CM

## 2021-09-23 DIAGNOSIS — R5383 Other fatigue: Secondary | ICD-10-CM | POA: Diagnosis not present

## 2021-09-23 LAB — URINALYSIS, ROUTINE W REFLEX MICROSCOPIC
Bilirubin Urine: NEGATIVE
Glucose, UA: NEGATIVE mg/dL
Ketones, ur: 15 mg/dL — AB
Leukocytes,Ua: NEGATIVE
Nitrite: NEGATIVE
Protein, ur: NEGATIVE mg/dL
Specific Gravity, Urine: 1.017 (ref 1.005–1.030)
pH: 6 (ref 5.0–8.0)

## 2021-09-23 LAB — CBC
HCT: 43 % (ref 36.0–46.0)
Hemoglobin: 14.3 g/dL (ref 12.0–15.0)
MCH: 30 pg (ref 26.0–34.0)
MCHC: 33.3 g/dL (ref 30.0–36.0)
MCV: 90.3 fL (ref 80.0–100.0)
Platelets: 304 10*3/uL (ref 150–400)
RBC: 4.76 MIL/uL (ref 3.87–5.11)
RDW: 14.8 % (ref 11.5–15.5)
WBC: 5.6 10*3/uL (ref 4.0–10.5)
nRBC: 0 % (ref 0.0–0.2)

## 2021-09-23 LAB — TROPONIN I (HIGH SENSITIVITY): Troponin I (High Sensitivity): <2 ng/L (ref <18)

## 2021-09-23 LAB — BASIC METABOLIC PANEL
Anion gap: 10 (ref 5–15)
BUN: 17 mg/dL (ref 6–20)
CO2: 27 mmol/L (ref 22–32)
Calcium: 10.1 mg/dL (ref 8.9–10.3)
Chloride: 102 mmol/L (ref 98–111)
Creatinine, Ser: 1.07 mg/dL — ABNORMAL HIGH (ref 0.44–1.00)
GFR, Estimated: >60 mL/min (ref >60)
Glucose, Bld: 92 mg/dL (ref 70–99)
Potassium: 3.9 mmol/L (ref 3.5–5.1)
Sodium: 139 mmol/L (ref 135–145)

## 2021-09-23 LAB — MAGNESIUM: Magnesium: 2.2 mg/dL (ref 1.7–2.4)

## 2021-09-23 LAB — PREGNANCY, URINE: Preg Test, Ur: NEGATIVE

## 2021-09-23 NOTE — Discharge Instructions (Addendum)
Continue to stay well-hydrated.  Avoid exercise until you are cleared by cardiologist.  Return for chest pain, passing out, stroke symptoms, shortness of breath or new concerns. Your heart enzyme troponin and magnesium levels were normal.

## 2021-09-23 NOTE — ED Notes (Signed)
Called lab to add on magnesium and troponin

## 2021-09-23 NOTE — ED Notes (Signed)
Pt discharged home after verbalizing understanding of discharge instructions; nad noted. 

## 2021-09-23 NOTE — ED Provider Notes (Signed)
[facility] EMERGENC[facility] DEPT Provider Note   CSN: [id number] Arrival date & time: 09/23/21  1554     History  Chief Complaint  Patient presents with   Dizziness   Weakness    Katie Coffey is a 51 y.o. female.  Patient presents with recurrent episodes of general weakness, brief palpitations and dizziness worse with standing.  Patient has been seen in urgent care for this and had general blood work done.  Patient saw primary doctor and had other blood work and evaluation including normal thyroid testing.  Patient denies any stroke symptoms.  No known blood clot history, cardiac history or new medications.  Patient's blood pressure is typically normal patient's elevated blood pressure while in the ED today.  Not currently on medications.  Patient's symptoms started intermittent since July 4 weekend and she had multiple days without any symptoms at all.       Home Medications Prior to Admission medications   Medication Sig Start Date End Date Taking? Authorizing Provider  levothyroxine (SYNTHROID) 75 MCG tablet (d.a.w-1) 1 tablet in the morning on an empty stomach 06/08/20   [provider]      Allergies    Other    Review of Systems   Review of Systems  Constitutional:  Negative for chills and fever.  HENT:  Negative for congestion.   Eyes:  Negative for visual disturbance.  Respiratory:  Negative for shortness of breath.   Cardiovascular:  Positive for palpitations. Negative for chest pain.  Gastrointestinal:  Negative for abdominal pain and vomiting.  Genitourinary:  Negative for dysuria and flank pain.  Musculoskeletal:  Negative for back pain, neck pain and neck stiffness.  Skin:  Negative for rash.  Neurological:  Positive for dizziness and weakness (general). Negative for light-headedness and headaches.    Physical Exam Updated Vital Signs BP 129/83   Pulse (!) 51   Temp 98.2 F (36.8 C)   Resp 19   Ht '5\' 1"'$  (1.549 m)   Wt 61.7 kg   LMP  09/22/2021 (Exact Date)   SpO2 100%   BMI 25.70 kg/m  Physical Exam Vitals and nursing note reviewed.  Constitutional:      General: She is not in acute distress.    Appearance: She is well-developed.  HENT:     Head: Normocephalic and atraumatic.     Mouth/Throat:     Mouth: Mucous membranes are moist.  Eyes:     General:        Right eye: No discharge.        Left eye: No discharge.     Conjunctiva/sclera: Conjunctivae normal.  Neck:     Trachea: No tracheal deviation.  Cardiovascular:     Rate and Rhythm: Regular rhythm. Bradycardia present.     Heart sounds: No murmur heard. Pulmonary:     Effort: Pulmonary effort is normal.     Breath sounds: Normal breath sounds.  Abdominal:     General: There is no distension.     Palpations: Abdomen is soft.     Tenderness: There is no abdominal tenderness. There is no guarding.  Musculoskeletal:     Cervical back: Normal range of motion and neck supple. No rigidity.  Skin:    General: Skin is warm.     Capillary Refill: Capillary refill takes less than 2 seconds.     Findings: No rash.  Neurological:     General: No focal deficit present.     Mental Status: She is  alert.     Cranial Nerves: No cranial nerve deficit.     Sensory: No sensory deficit.     Motor: No weakness.     Coordination: Coordination normal.  Psychiatric:        Mood and Affect: Mood normal.     ED Results / Procedures / Treatments   Labs (all labs ordered are listed, but only abnormal results are displayed) Labs Reviewed  BASIC METABOLIC PANEL - Abnormal; Notable for the following components:      Result Value   Creatinine, Ser 1.07 (*)    All other components within normal limits  URINALYSIS, ROUTINE W REFLEX MICROSCOPIC - Abnormal; Notable for the following components:   Hgb urine dipstick SMALL (*)    Ketones, ur 15 (*)    All other components within normal limits  CBC  PREGNANCY, URINE  MAGNESIUM  TROPONIN I (HIGH SENSITIVITY)     EKG EKG Interpretation  Date/Time:  Sunday September 23 2021 16:08:01 EDT Ventricular Rate:  59 PR Interval:  138 QRS Duration: 78 QT Interval:  432 QTC Calculation: 427 R Axis:   13 Text Interpretation: Sinus bradycardia Otherwise normal ECG When compared with ECG of 04-Sep-2014 23:02, PREVIOUS ECG IS PRESENT Confirmed by Elnora Morrison [phone]) on 09/23/2021 5:41:04 PM  Radiology No results found.  Procedures Procedures    Medications Ordered in ED Medications - No data to display  ED Course/ Medical Decision Making/ A&P                           Medical Decision Making Amount and/or Complexity of Data Reviewed Labs: ordered.   Patient presents with intermittent general weakness and orthostatic like symptoms differential includes orthostatic hypotension, dehydration, anemia, electrolyte abnormalities, atypical cardiac, valvular stenosis, blood pressure related, other.  Patient general blood work ordered and reviewed independently and overall unremarkable except for mild elevated creatinine 1.07 just minimally elevated compared to previous 0.9.  Patient is on menstrual cycle however no other evidence or concern for bleeding.  Hemoglobin normal.  Electrolytes unremarkable.  Patient's had no chest pain or shortness of breath, troponin added in case atypical cardiac.  With recurrent episodes discussed importance of follow-up with cardiology for further evaluation which may include echo/stress test/tilt table/Holter monitor.  Patient on the monitor in the ER and doing well symptoms minimal at this time.  Urinalysis reviewed minimal hemoglobin patient on menstrual cycle, pregnancy test negative.  Patient has normal neurologic exam and has not had signs or symptoms of stroke, discussed unable to get MRI today in the ER however unlikely would help with the work-up at this time.  Blood pressure elevated multiple times in the ER however patient's had normal blood pressure with similar  symptoms so discussed this might be related.  Patient's troponin level was negative and magnesium level normal.  Patient stable for close outpatient follow-up with cardiology.        Final Clinical Impression(s) / ED Diagnoses Final diagnoses:  General weakness  Orthostatic dizziness    Rx / DC Orders ED Discharge Orders          Ordered    Ambulatory referral to Cardiology       Comments: If you have not heard from the Cardiology office within the next 72 hours please call [phone].   09/23/21 6433              Elnora Morrison, MD 09/23/21 1902

## 2021-09-23 NOTE — ED Triage Notes (Signed)
Dizzy, lightheaded, fatigue for past couple of weeks. Went to [facility] walk in last week and was orthostatic but didn't go to the ER. Pt states she felt fine for the next few days after being evaluated then today started to feel off again and went back to [employer]. Pt states they advised that she come here to be evaluated and have blood work done. Pt denies any pain.

## 2021-09-23 NOTE — ED Notes (Signed)
Pt from home with recurrent weakness today, first occurring on 7/4. Pt states she just feels like she needs to lie down. Pt states she has seen pcp and walk in clinic in last two weeks, felt better for the last few days and then it came back worse today. Pt also reports vertigo when she lies down at night, which has happened before in the past. Pt denies any n/v/d/urinary symptoms. Pt has been eating and drinking normally; reports normal bowel pattern. Pt alert & oriented, nad noted.

## 2021-09-24 DIAGNOSIS — I10 Essential (primary) hypertension: Secondary | ICD-10-CM | POA: Diagnosis not present

## 2021-09-25 ENCOUNTER — Other Ambulatory Visit: Payer: Self-pay

## 2021-09-25 ENCOUNTER — Ambulatory Visit: Payer: BC Managed Care – PPO | Admitting: Cardiology

## 2021-09-25 ENCOUNTER — Telehealth: Payer: Self-pay | Admitting: *Deleted

## 2021-09-25 ENCOUNTER — Ambulatory Visit (INDEPENDENT_AMBULATORY_CARE_PROVIDER_SITE_OTHER): Payer: BC Managed Care – PPO

## 2021-09-25 ENCOUNTER — Encounter: Payer: Self-pay | Admitting: Cardiology

## 2021-09-25 VITALS — BP 112/90 | HR 77 | Ht 61.0 in | Wt 135.2 lb

## 2021-09-25 DIAGNOSIS — R42 Dizziness and giddiness: Secondary | ICD-10-CM

## 2021-09-25 DIAGNOSIS — R002 Palpitations: Secondary | ICD-10-CM | POA: Diagnosis not present

## 2021-09-25 NOTE — Progress Notes (Unsigned)
Enrolled for [employer] to mail a ZIO XT long term holter monitor to the patients address on file.  

## 2021-09-25 NOTE — Telephone Encounter (Signed)
Pt was seen in the office today by Dr. Radford Pax who ordered an Itamar study. Pt has set up the sleep app on her phone for the study. Pt agreeable to signed waiver and to not open the box until she has been called with the PIN# and ok to proceed.

## 2021-09-25 NOTE — Addendum Note (Signed)
Addended by: Antonieta Iba on: 09/25/2021 09:34 AM   Modules accepted: Orders

## 2021-09-25 NOTE — Progress Notes (Addendum)
Cardiology CONSULT Note    Date:  09/25/2021   ID:  Katie Coffey, DOB [birth year], MRN [id number]  PCP:  Harlan Stains, MD  Cardiologist:  Fransico Him, MD   Chief Complaint  Patient presents with   New Patient (Initial Visit)    Dizziness and palpitations and HTN    History of Present Illness:  Katie Coffey is a 51 y.o. female who is being seen today for the evaluation of dizziness at the request of Harlan Stains, MD.  This is a 51 year old female with a history of DVT, gallstones and GERD as well as hypothyroidism who recently presented to the emergency room 2 days ago with complaints of palpitations, generalized weakness and dizziness worse with standing.  She has been seen in urgent care before for this and also saw her PCP with normal thyroid testing.  Her symptoms started over July 4 weekend and have been intermittent since then.  She has these episodes multiple times a day.  In ER serum creatinine is mildly elevated at 1.07, hemoglobin normal, electrolytes normal.    She tells me that her main symptom now is weakness.  She was seen at [facility] clinic initially and BP was fine but had a 67mHg drop in her BP with orthostatics.  She saw her PCP and then she felt fine and her BP was fine as well as orthostatic BPs.  A week later she awakened feeling very weak with no energy and felt like she had to lay down but did not feel like she was going to pass out.  She does have a hx of hypoglycemia in the past. She got a BP cuff and her BP was actually high.  She went back to the walkin clinic and her BP was high at 177/957mg and she was sent to the ER.  Workup there was normal except for BP [id number]. She was started on Amlodipine '5mg'$  daily and BP is better today.   Yesterday am she felt very tired and felt weak even laying down and BP was in the 19086'Pystolic. She has noticed some palpitations as well as headaches at times but is not sure whether they all occur at the same time.   She has had to take meds for her HA's.  She has never had HTN in the past but her brother has HTN.     Past Medical History:  Diagnosis Date   DVT (deep venous thrombosis) (HCC)    Fibrocystic breast    Gallstones    GERD (gastroesophageal reflux disease)    Hypothyroid     Past Surgical History:  Procedure Laterality Date   CESAREAN SECTION     x3   CHOLECYSTECTOMY N/A 09/16/2014   Procedure: LAPAROSCOPIC CHOLECYSTECTOMY WITH INTRAOPERATIVE CHOLANGIOGRAM;  Surgeon: ThErroll LunaMD;  [facility]: [facility] Service: General;  Laterality: N/A;   OVARIAN CYST SURGERY      Current Medications: Current Meds  Medication Sig   amLODipine (NORVASC) 5 MG tablet Take 5 mg by mouth daily.   levothyroxine (SYNTHROID) 75 MCG tablet (d.a.w-1) 1 tablet in the morning on an empty stomach    Allergies:   Other   Social History   Socioeconomic History   Marital status: Married    Spouse name: Not on file   Number of children: Not on file   Years of education: Not on file   Highest education level: Not on file  Occupational History   Not on file  Tobacco Use   Smoking status: Never   Smokeless tobacco: Never  Substance and Sexual Activity   Alcohol use: Yes    Comment: Once a month.    Drug use: No   Sexual activity: Yes    Birth control/protection: None  Other Topics Concern   Not on file  Social History Narrative   Not on file   Social Determinants of Health   Financial Resource Strain: Not on file  Food Insecurity: Not on file  Transportation Needs: Not on file  Physical Activity: Not on file  Stress: Not on file  Social Connections: Not on file     Family History:  The patient's family history is not on file.   ROS:   Please see the history of present illness.    ROS All other systems reviewed and are negative.      No data to display             PHYSICAL EXAM:   VS:  BP 112/90   Pulse 77   Ht '5\' 1"'$  (1.549 m)   Wt 135 lb 3.2 oz  (61.3 kg)   LMP 09/22/2021 (Exact Date)   SpO2 96%   BMI 25.55 kg/m    GEN: Well nourished, well developed, in no acute distress  HEENT: normal  Neck: no JVD, carotid bruits, or masses Cardiac: RRR; no murmurs, rubs, or gallops,no edema.  Intact distal pulses bilaterally.  Respiratory:  clear to auscultation bilaterally, normal work of breathing GI: soft, nontender, nondistended, + BS MS: no deformity or atrophy  Skin: warm and dry, no rash Neuro:  Alert and Oriented x 3, Strength and sensation are intact Psych: euthymic mood, full affect  Wt Readings from Last 3 Encounters:  09/25/21 135 lb 3.2 oz (61.3 kg)  09/23/21 136 lb (61.7 kg)  06/27/21 135 lb (61.2 kg)      Studies/Labs Reviewed:   EKG:  EKG is not ordered today.  The ekg ordered  in the ER demonstrates sinus bradycardia 59 bpm with no ST changes  Recent Labs: 09/23/2021: BUN 17; Creatinine, Ser 1.07; Hemoglobin 14.3; Magnesium 2.2; Platelets 304; Potassium 3.9; Sodium 139   Lipid Panel No results found for: "CHOL", "TRIG", "HDL", "CHOLHDL", "VLDL", "LDLCALC", "LDLDIRECT"   Additional studies/ records that were reviewed today include:  Hospital notes, EKG, labs from hospital    ASSESSMENT:    1. Dizziness   2. Palpitations      PLAN:  In order of problems listed above:  Dizziness -Some of her symptoms sound orthostatic -She has not had any syncope -Labs in ER were normal at except for a minimally elevated serum creatinine -Urine  specific gravity was normal -Orthostatic blood pressures today in the office were normal -Encouraged her to stay well-hydrated and avoid being outside in extreme heat -If she is going to stand for long periods of time recommend compression hose -orthostatics normal on exam today -Check AM cortisol level to rule out adrenal issues -her HR is normal on orthostatics so do not think this is POTS -check 2D echo  2. Palpitations -We will get a 2-week Zio patch to rule out  arrhythmia  3.  HTN -BP recently has been significantly elevated with no hx of HTN -BP controlled today after starting amlodipine -continue prescription drug therapy with Amlodipine 2.'5mg'$  daily with PRN refills -check renal dopplers to rule out RAS -check 24hr urine for catechols, metanephrines, VMA, dopamine, cortisol, 5HIAA -check Itamar home sleep study -check renin, aldo and  PRA levels  Followup with me in 4 weeks  Time Spent: 20 minutes total time of encounter, including 15 minutes spent in face-to-face patient care on the date of this encounter. This time includes coordination of care and counseling regarding above mentioned problem list. Remainder of non-face-to-face time involved reviewing chart documents/testing relevant to the patient encounter and documentation in the medical record. I have independently reviewed documentation from referring provider  Medication Adjustments/Labs and Tests Ordered: Current medicines are reviewed at length with the patient today.  Concerns regarding medicines are outlined above.  Medication changes, Labs and Tests ordered today are listed in the Patient Instructions below.  There are no Patient Instructions on file for this visit.   Signed, Fransico Him, MD  09/25/2021 9:19 AM    [facility] [address], [place], [place]  [address] Phone: [phone]; Fax: [phone]

## 2021-09-25 NOTE — Patient Instructions (Addendum)
Medication Instructions:  Your physician recommends that you continue on your current medications as directed. Please refer to the Current Medication list given to you today.  *If you need a refill on your cardiac medications before your next appointment, please call your pharmacy*  Lab Work: 24 hour urine, AM cortisol, and aldosterone/renin - go to [facility] first thing in the morning.   Testing/Procedures: Your physician has requested that you have an echocardiogram. Echocardiography is a painless test that uses sound waves to create images of your heart. It provides your doctor with information about the size and shape of your heart and how well your heart's chambers and valves are working. This procedure takes approximately one hour. There are no restrictions for this procedure.  Your physician has recommended that you wear an event monitor. Event monitors are medical devices that record the heart's electrical activity. Doctors most often [place] these monitors to diagnose arrhythmias. Arrhythmias are problems with the speed or rhythm of the heartbeat. The monitor is a small, portable device. You can wear one while you do your normal daily activities. This is usually used to diagnose what is causing palpitations/syncope (passing out).  Your physician has requested that you have a renal artery duplex. During this test, an ultrasound is used to evaluate blood flow to the kidneys. Allow one hour for this exam. Do not eat after midnight the day before and avoid carbonated beverages. Take your medications as you usually do.  Your physician has recommended that you have a sleep study. This test records several body functions during sleep, including: brain activity, eye movement, oxygen and carbon dioxide blood levels, heart rate and rhythm, breathing rate and rhythm, the flow of air through your mouth and nose, snoring, body muscle movements, and chest and belly movement.  Follow-Up: At [facility], you  and your health needs are our priority.  As part of our continuing mission to provide you with exceptional heart care, we have created designated Provider Care Teams.  These Care Teams include your primary Cardiologist (physician) and Advanced Practice Providers (APPs -  Physician Assistants and Nurse Practitioners) who all work together to provide you with the care you need, when you need it.    Your next appointment:   4 week(s)  The format for your next appointment:   In Person  Provider:   Fransico Him, MD    Paraje Monitor Instructions  Your physician has requested you wear a ZIO patch monitor for 14 days.  This is a single patch monitor. [employer] supplies one patch monitor per enrollment. Additional stickers are not available. Please do not apply patch if you will be having a Nuclear Stress Test,  Echocardiogram, Cardiac CT, MRI, or Chest Xray during the period you would be wearing the  monitor. The patch cannot be worn during these tests. You cannot remove and re-apply the  ZIO XT patch monitor.  Your ZIO patch monitor will be mailed 3 day USPS to your address on file. It may take 3-5 days  to receive your monitor after you have been enrolled.  Once you have received your monitor, please review the enclosed instructions. Your monitor  has already been registered assigning a specific monitor serial # to you.  Billing and Patient Assistance Program Information  We have supplied [employer] with any of your insurance information on file for billing purposes. [employer] offers a sliding scale Patient Assistance Program for patients that do not have  insurance, or whose insurance does  not completely cover the cost of the ZIO monitor.  You must apply for the Patient Assistance Program to qualify for this discounted rate.  To apply, please call [employer] at [phone], select option 4, select option 2, ask to apply for  Patient Assistance Program. Theodore Demark will ask your household  income, and how many people  are in your household. They will quote your out-of-pocket cost based on that information.  [employer] will also be able to set up a 103-month interest-free payment plan if needed.  Applying the monitor   Shave hair from upper left chest.  Hold abrader disc by orange tab. Rub abrader in 40 strokes over the upper left chest as  indicated in your monitor instructions.  Clean area with 4 enclosed alcohol pads. Let dry.  Apply patch as indicated in monitor instructions. Patch will be placed under collarbone on left  side of chest with arrow pointing upward.  Rub patch adhesive wings for 2 minutes. Remove white label marked "1". Remove the white  label marked "2". Rub patch adhesive wings for 2 additional minutes.  While looking in a mirror, press and release button in center of patch. A small green light will  flash 3-4 times. This will be your only indicator that the monitor has been turned on.  Do not shower for the first 24 hours. You may shower after the first 24 hours.  Press the button if you feel a symptom. You will hear a small click. Record Date, Time and  Symptom in the Patient Logbook.  When you are ready to remove the patch, follow instructions on the last 2 pages of Patient  Logbook. Stick patch monitor onto the last page of Patient Logbook.  Place Patient Logbook in the blue and white box. Use locking tab on box and tape box closed  securely. The blue and white box has prepaid postage on it. Please place it in the mailbox as  soon as possible. Your physician should have your test results approximately 7 days after the  [facility]  Call [employer] [phone] you have questions regarding  your ZIO XT patch monitor. Call them immediately if you see an orange light blinking on your  monitor.  If your monitor falls off in less than 4 days, contact our Monitor department at [phone]  If your  monitor becomes loose or falls off after 4 days call [employer] at [phone]  suggestions on securing your monitor   Important Information About Sugar

## 2021-09-25 NOTE — Telephone Encounter (Signed)
ITAMAR SET UP 09/25/21, DR. Radford Pax

## 2021-09-26 ENCOUNTER — Encounter: Payer: Self-pay | Admitting: Cardiology

## 2021-09-26 DIAGNOSIS — E269 Hyperaldosteronism, unspecified: Secondary | ICD-10-CM

## 2021-09-26 DIAGNOSIS — R42 Dizziness and giddiness: Secondary | ICD-10-CM | POA: Diagnosis not present

## 2021-09-26 DIAGNOSIS — R002 Palpitations: Secondary | ICD-10-CM | POA: Diagnosis not present

## 2021-09-26 NOTE — Telephone Encounter (Signed)
Prior Authorization for [facility] sent to [facility] via web portal. Order ID: [id number] Approval Valid Through:09/26/2021 - 11/24/2021

## 2021-09-27 DIAGNOSIS — R42 Dizziness and giddiness: Secondary | ICD-10-CM

## 2021-09-27 DIAGNOSIS — R002 Palpitations: Secondary | ICD-10-CM | POA: Diagnosis not present

## 2021-09-28 ENCOUNTER — Encounter (INDEPENDENT_AMBULATORY_CARE_PROVIDER_SITE_OTHER): Payer: BC Managed Care – PPO | Admitting: Cardiology

## 2021-09-28 DIAGNOSIS — G4733 Obstructive sleep apnea (adult) (pediatric): Secondary | ICD-10-CM

## 2021-09-28 NOTE — Telephone Encounter (Signed)
Left message ok per DPR, PIN # [id number] for Itamar sleep study and ok to proceed. Will note if study can be done sometime between tonight and early next week. If this is not possible please call Arbie Cookey and let me know when you can do the sleep study.

## 2021-09-30 LAB — CORTISOL-AM, BLOOD: Cortisol - AM: 26.6 ug/dL — ABNORMAL HIGH (ref 6.2–19.4)

## 2021-09-30 LAB — ALDOSTERONE + RENIN ACTIVITY W/ RATIO
ALDOS/RENIN RATIO: 17.4 (ref 0.0–30.0)
ALDOSTERONE: 35.7 ng/dL — ABNORMAL HIGH (ref 0.0–30.0)
Renin: 2.049 ng/mL/hr (ref 0.167–5.380)

## 2021-10-01 ENCOUNTER — Telehealth: Payer: Self-pay

## 2021-10-01 DIAGNOSIS — R7989 Other specified abnormal findings of blood chemistry: Secondary | ICD-10-CM

## 2021-10-01 LAB — CATECHOLAMINES, FRACTIONATED, URINE, 24 HOUR
Dopamine , 24H Ur: 248 ug/24 hr (ref 0–510)
Dopamine, Rand Ur: 248 ug/L
Epinephrine, 24H Ur: 5 ug/24 hr (ref 0–20)
Epinephrine, Rand Ur: 5 ug/L
Norepinephrine, 24H Ur: 40 ug/24 hr (ref 0–135)
Norepinephrine, Rand Ur: 40 ug/L

## 2021-10-01 LAB — METANEPHRINES, URINE, 24 HOUR
Metaneph Total, Ur: 137 ug/L
Metanephrines, 24H Ur: 137 ug/24 hr (ref 36–209)
Normetanephrine, 24H Ur: 327 ug/24 hr (ref 131–612)
Normetanephrine, Ur: 327 ug/L

## 2021-10-01 LAB — 5 HIAA, QUANTITATIVE, URINE, 24 HOUR
5-HIAA, Ur: 4.1 mg/L
5-HIAA,Quant.,24 Hr Urine: 4.1 mg/24 hr (ref 0.0–14.9)

## 2021-10-01 LAB — VANILLYLMANDELIC ACID, 24-HR U
VMA, 24H Ur Adult: 6.3 mg/24 hr (ref 0.0–7.5)
VMA, Urine: 6.3 mg/L

## 2021-10-01 NOTE — Telephone Encounter (Signed)
Patient notified via MyChart. Referral has been placed.  

## 2021-10-01 NOTE — Telephone Encounter (Signed)
-----   Message from Sueanne Margarita, MD sent at 09/30/2021  9:15 PM EDT ----- Aldosterone mildly elevated - please refer to Dr. Chalmers Cater for further workup

## 2021-10-02 NOTE — Procedures (Signed)
       SLEEP STUDY REPORT Patient Information Study Date: 09/28/21 Patient Name: Katie Coffey Patient ID: [id number] Birth Date: December 14, 2070 Age: 51 Gender: Female BMI: 25.4 (W=134 lb, H=5' 1'') Neck Circ.: 30 '' Referring Physician: Fransico Him, MD  TEST DESCRIPTION: Home sleep apnea testing was completed using the WatchPat, a Type 1 device, utilizing peripheral arterial tonometry (PAT), chest movement, actigraphy, pulse oximetry, pulse rate, body position and snore. AHI was calculated with apnea and hypopnea using valid sleep time as the denominator. RDI includes apneas, hypopneas, and RERAs. The data acquired and the scoring of sleep and all associated events were performed in accordance with the recommended standards and specifications as outlined in the AASM Manual for the Scoring of Sleep and Associated Events 2.2.0 (2015).  FINDINGS: 1. Mild Obstructive Sleep Apnea with AHI 8.9/hr. 2. No Central Sleep Apnea with pAHIc 0.3/hr. 3. Oxygen desaturations as low as 87%. 4. Mild to moderate snoring was present. O2 sats were < 88% for 0 min. 5. Total sleep time was 7 hrs and 18 min. 6. 27.2% of total sleep time was spent in REM sleep. 7. Normal sleep onset latency at 18 min. 8. Shortened REM sleep onset latency at 58 min. 9. Total awakenings were 5.  DIAGNOSIS: Mild Obstructive Sleep Apnea (G47.33)  RECOMMENDATIONS: 1. Clinical correlation of these findings is necessary. The decision to treat obstructive sleep apnea (OSA) is usually based on the presence of apnea symptoms or the presence of associated medical conditions such as Hypertension, Congestive Heart Failure, Atrial Fibrillation or Obesity. The most common symptoms of OSA are snoring, gasping for breath while sleeping, daytime sleepiness and fatigue.  2. Initiating apnea therapy is recommended given the presence of symptoms and/or associated conditions. Recommend proceeding with one of the following:   a. Auto-CPAP  therapy with a pressure range of 5-20cm H2O.   b. An oral appliance (OA) that can be obtained from certain dentists with expertise in sleep medicine. These are primarily of use in non-obese patients with mild and moderate disease.   c. An ENT consultation which may be useful to look for specific causes of obstruction and possible treatment options.   d. If patient is intolerant to PAP therapy, consider referral to ENT for evaluation for hypoglossal nerve stimulator.  3. Close follow-up is necessary to ensure success with CPAP or oral appliance therapy for maximum benefit .  4. A follow-up oximetry study on CPAP is recommended to assess the adequacy of therapy and determine the need for supplemental oxygen or the potential need for Bi-level therapy. An arterial blood gas to determine the adequacy of baseline ventilation and oxygenation should also be considered.  5. Healthy sleep recommendations include: adequate nightly sleep (normal 7-9 hrs/night), avoidance of caffeine after noon and alcohol near bedtime, and maintaining a sleep environment that is cool, dark and quiet.  6. Weight loss for overweight patients is recommended. Even modest amounts of weight loss can significantly improve the severity of sleep apnea.  7. Snoring recommendations include: weight loss where appropriate, side sleeping, and avoidance of alcohol before bed.  8. Operation of motor vehicle should be avoided when sleepy.  Signature: Electronically Signed: 10/03/21 Fransico Him, MD; [facility]; [place], American Board of Sleep Medicine

## 2021-10-02 NOTE — Progress Notes (Deleted)
    SLEEP STUDY REPORT Patient Information Study Date: 09/28/21 Patient Name: Katie Coffey Patient ID: [id number] Birth Date: 2070-05-15 Age: 51 Gender: Female BMI: 25.4 (W=134 lb, H=5' 1'') Neck Circ.: 77 '' Referring Physician: Fransico Him, MD  TEST DESCRIPTION: Home sleep apnea testing was completed using the WatchPat, a Type 1 device, utilizing peripheral arterial tonometry (PAT), chest movement, actigraphy, pulse oximetry, pulse rate, body position and snore. AHI was calculated with apnea and hypopnea using valid sleep time as the denominator. RDI includes apneas, hypopneas, and RERAs. The data acquired and the scoring of sleep and all associated events were performed in accordance with the recommended standards and specifications as outlined in the AASM Manual for the Scoring of Sleep and Associated Events 2.2.0 (2015).  FINDINGS: 1. Mild Obstructive Sleep Apnea with AHI 8.9/hr. 2. No Central Sleep Apnea with pAHIc 0.3/hr. 3. Oxygen desaturations as low as 87%. 4. Mild to moderate snoring was present. O2 sats were < 88% for 0 min. 5. Total sleep time was 7 hrs and 18 min. 6. 27.2% of total sleep time was spent in REM sleep. 7. Normal sleep onset latency at 18 min. 8. Shortened REM sleep onset latency at 58 min. 9. Total awakenings were 5.  DIAGNOSIS: Mild Obstructive Sleep Apnea (G47.33)  RECOMMENDATIONS: 1. Clinical correlation of these findings is necessary. The decision to treat obstructive sleep apnea (OSA) is usually based on the presence of apnea symptoms or the presence of associated medical conditions such as Hypertension, Congestive Heart Failure, Atrial Fibrillation or Obesity. The most common symptoms of OSA are snoring, gasping for breath while sleeping, daytime sleepiness and fatigue.  2. Initiating apnea therapy is recommended given the presence of symptoms and/or associated conditions. Recommend proceeding with one of the following:   a. Auto-CPAP therapy  with a pressure range of 5-20cm H2O.   b. An oral appliance (OA) that can be obtained from certain dentists with expertise in sleep medicine. These are primarily of use in non-obese patients with mild and moderate disease.   c. An ENT consultation which may be useful to look for specific causes of obstruction and possible treatment options.   d. If patient is intolerant to PAP therapy, consider referral to ENT for evaluation for hypoglossal nerve stimulator.  3. Close follow-up is necessary to ensure success with CPAP or oral appliance therapy for maximum benefit .  4. A follow-up oximetry study on CPAP is recommended to assess the adequacy of therapy and determine the need for supplemental oxygen or the potential need for Bi-level therapy. An arterial blood gas to determine the adequacy of baseline ventilation and oxygenation should also be considered.  5. Healthy sleep recommendations include: adequate nightly sleep (normal 7-9 hrs/night), avoidance of caffeine after noon and alcohol near bedtime, and maintaining a sleep environment that is cool, dark and quiet.  6. Weight loss for overweight patients is recommended. Even modest amounts of weight loss can significantly improve the severity of sleep apnea.  7. Snoring recommendations include: weight loss where appropriate, side sleeping, and avoidance of alcohol before bed.  8. Operation of motor vehicle should be avoided when sleepy.  Signature: Electronically Signed: 10/03/21 Fransico Him, MD; [facility]; [place], American Board of Sleep Medicine

## 2021-10-03 ENCOUNTER — Ambulatory Visit: Payer: BC Managed Care – PPO

## 2021-10-03 DIAGNOSIS — R002 Palpitations: Secondary | ICD-10-CM

## 2021-10-03 DIAGNOSIS — R42 Dizziness and giddiness: Secondary | ICD-10-CM

## 2021-10-08 ENCOUNTER — Telehealth: Payer: Self-pay | Admitting: Cardiology

## 2021-10-08 NOTE — Telephone Encounter (Signed)
Katie Coffey from [facility] states they received a referral for endocrinology but need labs sent over. Fax: [phone] Attention [facility]

## 2021-10-08 NOTE — Telephone Encounter (Signed)
Labs have been faxed.

## 2021-10-09 ENCOUNTER — Telehealth: Payer: Self-pay | Admitting: *Deleted

## 2021-10-09 NOTE — Telephone Encounter (Signed)
-----   Message from Sueanne Margarita, MD sent at 10/02/2021  9:25 PM EDT ----- Patient has very mild OSA - set up OV to discuss treatment options.

## 2021-10-09 NOTE — Telephone Encounter (Signed)
Staff message sent to [facility], scheduler.

## 2021-10-10 NOTE — Addendum Note (Signed)
Addended by: Antonieta Iba on: 10/10/2021 12:12 PM   Modules accepted: Orders

## 2021-10-12 DIAGNOSIS — E039 Hypothyroidism, unspecified: Secondary | ICD-10-CM | POA: Diagnosis not present

## 2021-10-12 DIAGNOSIS — E269 Hyperaldosteronism, unspecified: Secondary | ICD-10-CM | POA: Diagnosis not present

## 2021-10-12 DIAGNOSIS — I160 Hypertensive urgency: Secondary | ICD-10-CM | POA: Diagnosis not present

## 2021-10-15 ENCOUNTER — Ambulatory Visit (HOSPITAL_COMMUNITY): Payer: BC Managed Care – PPO | Attending: Cardiovascular Disease

## 2021-10-15 DIAGNOSIS — R42 Dizziness and giddiness: Secondary | ICD-10-CM | POA: Diagnosis not present

## 2021-10-15 DIAGNOSIS — R002 Palpitations: Secondary | ICD-10-CM | POA: Insufficient documentation

## 2021-10-15 LAB — ECHOCARDIOGRAM COMPLETE
Area-P 1/2: 4.18 cm2
S' Lateral: 2.5 cm

## 2021-10-16 DIAGNOSIS — R42 Dizziness and giddiness: Secondary | ICD-10-CM | POA: Diagnosis not present

## 2021-10-16 DIAGNOSIS — R002 Palpitations: Secondary | ICD-10-CM | POA: Diagnosis not present

## 2021-10-17 ENCOUNTER — Ambulatory Visit (HOSPITAL_COMMUNITY)
Admission: RE | Admit: 2021-10-17 | Discharge: 2021-10-17 | Disposition: A | Discharge status: Home / Self Care | Payer: BC Managed Care – PPO | Source: Ambulatory Visit | Attending: Cardiovascular Disease | Admitting: Cardiovascular Disease

## 2021-10-17 DIAGNOSIS — I10 Essential (primary) hypertension: Secondary | ICD-10-CM | POA: Diagnosis not present

## 2021-10-17 DIAGNOSIS — R42 Dizziness and giddiness: Secondary | ICD-10-CM | POA: Insufficient documentation

## 2021-10-17 DIAGNOSIS — R002 Palpitations: Secondary | ICD-10-CM | POA: Insufficient documentation

## 2021-10-18 ENCOUNTER — Telehealth: Payer: Self-pay | Admitting: Cardiology

## 2021-10-18 ENCOUNTER — Other Ambulatory Visit: Payer: Self-pay | Admitting: *Deleted

## 2021-10-18 MED ORDER — METOPROLOL SUCCINATE ER 25 MG PO TB24: 25.0000 mg | ORAL_TABLET | Freq: Every day | ORAL | 3 refills | Status: DC

## 2021-10-18 NOTE — Telephone Encounter (Signed)
Sueanne Margarita, MD  10/15/2021  1:47 PM EDT     Echo showed normal LVF with with mildly leaky MV     Heart monitor showed 2 episodes of SVT the longest lasting 17 beats.   stop amlodipine and start Toprol-XL 25 mg daily.  Check BP and heart rate daily for a week and call with results.  Have her follow-up with Sharrell Ku in 4 weeks

## 2021-10-18 NOTE — Telephone Encounter (Signed)
PT AWARE OF MONITOR AND ECHO RESULTS AS WELL AND HAS F/U APPT WITH DR TURNER FOR 8/24 AND PT WILL KEEP THIS APPT .Adonis Housekeeper

## 2021-10-18 NOTE — Telephone Encounter (Signed)
Patient return call for results

## 2021-10-22 ENCOUNTER — Other Ambulatory Visit (HOSPITAL_COMMUNITY): Payer: BC Managed Care – PPO

## 2021-10-22 DIAGNOSIS — L309 Dermatitis, unspecified: Secondary | ICD-10-CM | POA: Diagnosis not present

## 2021-10-23 ENCOUNTER — Encounter (HOSPITAL_COMMUNITY): Payer: BC Managed Care – PPO

## 2021-10-30 DIAGNOSIS — I160 Hypertensive urgency: Secondary | ICD-10-CM | POA: Diagnosis not present

## 2021-11-01 ENCOUNTER — Ambulatory Visit: Payer: BC Managed Care – PPO | Admitting: Cardiology

## 2021-11-01 ENCOUNTER — Encounter: Payer: Self-pay | Admitting: Cardiology

## 2021-11-01 ENCOUNTER — Telehealth: Payer: Self-pay | Admitting: *Deleted

## 2021-11-01 VITALS — BP 110/80 | HR 72 | Ht 61.0 in | Wt 140.8 lb

## 2021-11-01 DIAGNOSIS — G4733 Obstructive sleep apnea (adult) (pediatric): Secondary | ICD-10-CM | POA: Diagnosis not present

## 2021-11-01 DIAGNOSIS — R42 Dizziness and giddiness: Secondary | ICD-10-CM | POA: Diagnosis not present

## 2021-11-01 DIAGNOSIS — I10 Essential (primary) hypertension: Secondary | ICD-10-CM | POA: Diagnosis not present

## 2021-11-01 DIAGNOSIS — R0602 Shortness of breath: Secondary | ICD-10-CM | POA: Diagnosis not present

## 2021-11-01 DIAGNOSIS — T733XXA Exhaustion due to excessive exertion, initial encounter: Secondary | ICD-10-CM

## 2021-11-01 DIAGNOSIS — I471 Supraventricular tachycardia: Secondary | ICD-10-CM

## 2021-11-01 MED ORDER — METOPROLOL TARTRATE 100 MG PO TABS: ORAL_TABLET | ORAL | 0 refills | Status: DC

## 2021-11-01 NOTE — Telephone Encounter (Signed)
Order placed to [facility] via community.  Upon patient request DME selection is [facility]. Patient understands he will be contacted by [facility] to set up his cpap. Patient understands to call if [facility] does not contact him with new setup in a timely manner. Patient understands they will be called once confirmation has been received from [facility]/ that they have received their new machine to schedule 10 week follow up appointment.   [facility] notified of new cpap order  Please add to airview Patient was grateful for the call and thanked me.

## 2021-11-01 NOTE — Progress Notes (Signed)
Cardiology office Note    Date:  11/01/2021   ID:  Keali, Mccraw 09/25/1970, MRN [id number]  PCP:  Harlan Stains, MD  Cardiologist:  Fransico Him, MD   Chief Complaint  Patient presents with   Hypertension   Sleep Apnea   Palpitations    History of Present Illness:  Katie Coffey is a 51 y.o. female with a history of DVT, gallstones and GERD as well as hypothyroidism who recently presented to the emergency room 2 days ago with complaints of palpitations, generalized weakness and dizziness worse with standing.  She has been seen in urgent care before for this and also saw her PCP with normal thyroid testing.  Her symptoms started over July 4 weekend.  She had these episodes multiple times a day.  In ER serum creatinine is mildly elevated at 1.07, hemoglobin normal, electrolytes normal.    She was seen at [facility] initially and BP was fine but had a 32mHg drop in her BP with orthostatics.  She saw her PCP and then she felt fine and her BP was fine as well as orthostatic BPs.  A week later she awakened feeling very weak with no energy and felt like she had to lay down but did not feel like she was going to pass out.  She does have a hx of hypoglycemia in the past. She got a BP cuff and her BP was actually high.  She went back to the walkin clinic and her BP was high at 177/973mg and she was sent to the ER.  Workup there was normal except for BP [id number]. She was started on Amlodipine '5mg'$  daily.  2D echo was done 10/15/2021 showing normal LV function with EF 55 to 60% with normal LV global longitudinal strain, mild MR.  Event monitor showed normal sinus rhythm with average heart rate 67 bpm and ranged from 37 to 157 bpm with PVCs ventricular couplets PACs and atrial couplets and 2 episodes of SVT with the longest interval lasting 17 beats.  Her amlodipine was stopped and she was placed on Toprol-XL 25 mg daily.  She underwent home sleep study showing mild obstructive sleep  apnea with an AHI of 8.9/h and lowest O2 saturations 87% and RDI of 12/hr..  Renal duplex showed no evidence of renal artery stenosis.  A.m. cortisol was normal and actually mildly elevated at 26.6 and aldosterone was elevated at 35.7 with normal renin and PRA.  She has been referred to endocrinology and was seen with repeat labs and cortisol was normal but Aldo/PRA pending.   She is here today for followup and is doing well.  She tells me that her dizziness has resolved but still feels her stamina is poor and gets worn out quickly.  She still has some mild DOE but only with extreme exertion. She denies any chest pain or pressure, PND, orthopnea, LE edema, dizziness or syncope. Since going on Toprol her palpitations have resolved.She is compliant with her meds and is tolerating meds with no SE.    Past Medical History:  Diagnosis Date   DVT (deep venous thrombosis) (HCC)    Fibrocystic breast    Gallstones    GERD (gastroesophageal reflux disease)    Hypothyroid    OSA (obstructive sleep apnea)    home sleep study showing mild obstructive sleep apnea with an AHI of 8.9/h and lowest O2 saturations 87% and RDI of 12/hr    Past Surgical History:  Procedure Laterality Date  CESAREAN SECTION     x3   CHOLECYSTECTOMY N/A 09/16/2014   Procedure: LAPAROSCOPIC CHOLECYSTECTOMY WITH INTRAOPERATIVE CHOLANGIOGRAM;  Surgeon: Erroll Luna, MD;  Location: [facility];  Service: General;  Laterality: N/A;   OVARIAN CYST SURGERY      Current Medications: Current Meds  Medication Sig   levothyroxine (SYNTHROID) 75 MCG tablet (d.a.w-1) 1 tablet in the morning on an empty stomach   metoprolol succinate (TOPROL XL) 25 MG 24 hr tablet Take 1 tablet (25 mg total) by mouth at bedtime.   triamcinolone cream (KENALOG) 0.1 % Apply topically 2 (two) times daily as needed.    Allergies:   Other   Social History   Socioeconomic History   Marital status: Married    Spouse name: Not on file    Number of children: Not on file   Years of education: Not on file   Highest education level: Not on file  Occupational History   Not on file  Tobacco Use   Smoking status: Never   Smokeless tobacco: Never  Substance and Sexual Activity   Alcohol use: Yes    Comment: Once a month.    Drug use: No   Sexual activity: Yes    Birth control/protection: None  Other Topics Concern   Not on file  Social History Narrative   Not on file   Social Determinants of Health   Financial Resource Strain: Not on file  Food Insecurity: Not on file  Transportation Needs: Not on file  Physical Activity: Not on file  Stress: Not on file  Social Connections: Not on file     Family History:  The patient's family history is not on file.   ROS:   Please see the history of present illness.    ROS All other systems reviewed and are negative.      No data to display             PHYSICAL EXAM:   VS:  BP 110/80   Pulse 72   Ht '5\' 1"'$  (1.549 m)   Wt 140 lb 12.8 oz (63.9 kg)   SpO2 96%   BMI 26.60 kg/m    GEN: Well nourished, well developed in no acute distress HEENT: Normal NECK: No JVD; No carotid bruits LYMPHATICS: No lymphadenopathy CARDIAC:RRR, no murmurs, rubs, gallops RESPIRATORY:  Clear to auscultation without rales, wheezing or rhonchi  ABDOMEN: Soft, non-tender, non-distended MUSCULOSKELETAL:  No edema; No deformity  SKIN: Warm and dry NEUROLOGIC:  Alert and oriented x 3 PSYCHIATRIC:  Normal affect  Wt Readings from Last 3 Encounters:  11/01/21 140 lb 12.8 oz (63.9 kg)  09/25/21 135 lb 3.2 oz (61.3 kg)  09/23/21 136 lb (61.7 kg)      Studies/Labs Reviewed:   EKG:  EKG is not ordered today.  Recent Labs: 09/23/2021: BUN 17; Creatinine, Ser 1.07; Hemoglobin 14.3; Magnesium 2.2; Platelets 304; Potassium 3.9; Sodium 139   Lipid Panel No results found for: "CHOL", "TRIG", "HDL", "CHOLHDL", "VLDL", "LDLCALC", "LDLDIRECT"   Additional studies/ records that were reviewed  today include:  Home sleep study, renal duplex, labs, 2D echo, event monitor    ASSESSMENT:    1. Dizziness   2. SVT (supraventricular tachycardia) ([place])   3. Primary hypertension   4. OSA (obstructive sleep apnea)      PLAN:  In order of problems listed above:  Dizziness -Some of her symptoms sound orthostatic -She has not had any syncope -Labs in ER were normal at except  for a minimally elevated serum creatinine -Urine  specific gravity was normal -Orthostatic blood pressures at initial office visit were normal -A.m. cortisol was normal and actually mildly elevated -2D echo showed normal LV function with no valvular heart disease -Encouraged her to stay well-hydrated and avoid being outside in extreme heat -If she is going to stand for long periods of time recommend compression hose  2.  SVT -2-week Zio patch showed several episodes of SVT up to 19 beats in a row. -She is now on Toprol-XL 25 mg daily with significant improvement in palpitations -Continue Toprol-XL 25 mg daily with as needed refills  3.  HTN -BP is controlled on exam today -BP controlled today after starting amlodipine -Renal duplex showed no evidence of renal artery stenosis -She does have mild sleep apnea on recent sleep study -Aldosterone level was elevated but renin and PRA levels were normal.  She has been referred to endocrinology -24-hour urine was negative for catecholamines, dopamine, cortisol and VMA as well as metanephrines and normetanephrine's  4.  Obstructive sleep apnea/ -She has very mild sleep apnea with home sleep study showing an AHI of 8.9/h with O2 saturations as low as 87%. -given her fatigue I think she should have  trial of auto CPAP from 4 to 15cm H2O with heated humidity and nasal pillow mask and followup with me 6 weeks after getting her CPAP  5.  Excessive fatigue -this occurs to the point she has had to cut back on her activity -I think we need to rule out atypical angina  and underlying CAD so will get a coronary CTA  Followup with me in 6 months  Time Spent: 20 minutes total time of encounter, including 15 minutes spent in face-to-face patient care on the date of this encounter. This time includes coordination of care and counseling regarding above mentioned problem list. Remainder of non-face-to-face time involved reviewing chart documents/testing relevant to the patient encounter and documentation in the medical record. I have independently reviewed documentation from referring provider  Medication Adjustments/Labs and Tests Ordered: Current medicines are reviewed at length with the patient today.  Concerns regarding medicines are outlined above.  Medication changes, Labs and Tests ordered today are listed in the Patient Instructions below.  There are no Patient Instructions on file for this visit.   Signed, Fransico Him, MD  11/01/2021 1:58 PM    [facility] [place], [place], [place]  [address] Phone: [phone]; Fax: [phone]

## 2021-11-01 NOTE — Telephone Encounter (Signed)
-----   Message from [place], [place] sent at 11/01/2021  2:07 PM EDT ----- She has very mild sleep apnea with home sleep study showing an AHI of 8.9/h with O2 saturations as low as 87%. -given her fatigue I think she should have  trial of auto CPAP from 4 to 15cm H2O with heated humidity and nasal pillow (to be fitted in person) mask and followup with me 6 weeks after getting her CPAP

## 2021-11-01 NOTE — Addendum Note (Signed)
Addended by: Gaetano Net on: 11/01/2021 02:15 PM   Modules accepted: Orders

## 2021-11-01 NOTE — Patient Instructions (Addendum)
Medication Instructions:  Your physician recommends that you continue on your current medications as directed. Please refer to the Current Medication list given to you today.  *If you need a refill on your cardiac medications before your next appointment, please call your pharmacy*   Lab Work: TODAY:  BMET  If you have labs (blood work) drawn today and your tests are completely normal, you will receive your results only by: [place] (if you have MyChart) OR A paper copy in the mail If you have any lab test that is abnormal or we need to change your treatment, we will call you to review the results.   Testing/Procedures: Your physician has requested that you have cardiac CT. Cardiac computed tomography (CT) is a painless test that uses an x-ray machine to take clear, detailed pictures of your heart. For further information please visit [email or web address]. Please follow instruction sheet BELOW:      Your cardiac CT will be scheduled at one of the below locations:   [facility] [address] [place], [place] [address] [phone]   Arrive at the [employer], building services (Entrance C2) of [facility] 30 minutes prior to test start time. You can use the FREE valet parking offered at entrance C (encouraged to control the heart rate for the test)  Proceed to the [facility] Radiology Department (first floor) to check-in and test prep.  All radiology patients and guests should use entrance C2 at [facility], accessed from [facility], even though the hospital's physical address listed is [address]     Please follow these instructions carefully (unless otherwise directed):   On the Night Before the Test: Be sure to Drink plenty of water. Do not consume any caffeinated/decaffeinated beverages or chocolate 12 hours prior to your test. Do not take any antihistamines 12 hours prior to your test.   On the Day of  the Test: Drink plenty of water until 1 hour prior to the test. Do not eat any food 4 hours prior to the test. You may take your regular medications prior to the test EXCEPT THE METOPROLOL.  HOLD THAT DAY  Take metoprolol (Lopressor) 100 MG two hours prior to test. THIS HAS BEEN SENT TO [facility]- please wear underwire-free bra if available, avoid dresses & tight clothing    After the Test: Drink plenty of water. After receiving IV contrast, you may experience a mild flushed feeling. This is normal. On occasion, you may experience a mild rash up to 24 hours after the test. This is not dangerous. If this occurs, you can take Benadryl 25 mg and increase your fluid intake. If you experience trouble breathing, this can be serious. If it is severe call 911 IMMEDIATELY. If it is mild, please call our office.  We will call to schedule your test 2-4 weeks out understanding that some insurance companies will need an authorization prior to the service being performed.   For non-scheduling related questions, please contact the cardiac imaging nurse navigator should you have any questions/concerns: Marchia Bond, Cardiac Imaging Nurse Navigator Gordy Clement, Cardiac Imaging Nurse Navigator [facility] Direct Office Dial: [phone]   For scheduling needs, including cancellations and rescheduling, please call [place], [phone].      Follow-Up: At [facility], you and your health needs are our priority.  As part of our continuing mission to provide you with exceptional heart care, we have created designated Provider Care Teams.  These Care Teams include your primary Cardiologist (physician) and Advanced Practice Providers (APPs -  Physician Assistants and Nurse Practitioners) who all work together to provide you with the care you need, when you need it.  We recommend signing up for the patient portal called "MyChart".  Sign up information is provided on  this After Visit Summary.  MyChart is used to connect with patients for Virtual Visits (Telemedicine).  Patients are able to view lab/test results, encounter notes, upcoming appointments, etc.  Non-urgent messages can be sent to your provider as well.   To learn more about what you can do with MyChart, go to [email or web address].    Your next appointment:   Someone will call you   The format for your next appointment:   In Person  Provider:   Fransico Him, MD     Other Instructions   Important Information About [place]

## 2021-11-02 LAB — BASIC METABOLIC PANEL
BUN/Creatinine Ratio: 13 (ref 9–23)
BUN: 10 mg/dL (ref 6–24)
CO2: 25 mmol/L (ref 20–29)
Calcium: 9.1 mg/dL (ref 8.7–10.2)
Chloride: 102 mmol/L (ref 96–106)
Creatinine, Ser: 0.79 mg/dL (ref 0.57–1.00)
Glucose: 92 mg/dL (ref 70–99)
Potassium: 4.2 mmol/L (ref 3.5–5.2)
Sodium: 140 mmol/L (ref 134–144)
eGFR: 92 mL/min/{1.73_m2} (ref >59)

## 2021-11-13 DIAGNOSIS — F4323 Adjustment disorder with mixed anxiety and depressed mood: Secondary | ICD-10-CM | POA: Diagnosis not present

## 2021-11-13 DIAGNOSIS — I160 Hypertensive urgency: Secondary | ICD-10-CM | POA: Diagnosis not present

## 2021-11-19 ENCOUNTER — Telehealth (HOSPITAL_COMMUNITY): Payer: Self-pay | Admitting: Emergency Medicine

## 2021-11-19 DIAGNOSIS — F5101 Primary insomnia: Secondary | ICD-10-CM | POA: Diagnosis not present

## 2021-11-19 DIAGNOSIS — E162 Hypoglycemia, unspecified: Secondary | ICD-10-CM | POA: Diagnosis not present

## 2021-11-19 DIAGNOSIS — I10 Essential (primary) hypertension: Secondary | ICD-10-CM | POA: Diagnosis not present

## 2021-11-19 DIAGNOSIS — E039 Hypothyroidism, unspecified: Secondary | ICD-10-CM | POA: Diagnosis not present

## 2021-11-19 NOTE — Telephone Encounter (Signed)
Reaching out to patient to offer assistance regarding upcoming cardiac imaging study; pt verbalizes understanding of appt date/time, parking situation and where to check in, pre-test NPO status and medications ordered, and verified current allergies; name and call back number provided for further questions should they arise Katie Bond RN Navigator Cardiac Imaging [facility] [phone] office [phone] cell  Arrival 1100 w/c entrance Denies iv issues '100mg'$  metoprolol tartrte Aware nitro

## 2021-11-20 ENCOUNTER — Ambulatory Visit (HOSPITAL_COMMUNITY)
Admission: RE | Admit: 2021-11-20 | Discharge: 2021-11-20 | Disposition: A | Discharge status: Home / Self Care | Payer: BC Managed Care – PPO | Source: Ambulatory Visit | Attending: Cardiology | Admitting: Cardiology

## 2021-11-20 DIAGNOSIS — R0602 Shortness of breath: Secondary | ICD-10-CM | POA: Insufficient documentation

## 2021-11-20 DIAGNOSIS — T733XXA Exhaustion due to excessive exertion, initial encounter: Secondary | ICD-10-CM

## 2021-11-20 MED ORDER — NITROGLYCERIN 0.4 MG SL SUBL
0.8000 mg | SUBLINGUAL_TABLET | Freq: Once | SUBLINGUAL | Status: AC
  Administered 2021-11-20: 0.8 mg via SUBLINGUAL

## 2021-11-20 MED ORDER — IOHEXOL 350 MG/ML SOLN
100.0000 mL | Freq: Once | INTRAVENOUS | Status: AC | PRN
  Administered 2021-11-20: 100 mL via INTRAVENOUS

## 2021-11-20 MED ORDER — NITROGLYCERIN 0.4 MG SL SUBL
SUBLINGUAL_TABLET | SUBLINGUAL | Status: AC
  Filled 2021-11-20: qty 2

## 2021-11-21 DIAGNOSIS — F4323 Adjustment disorder with mixed anxiety and depressed mood: Secondary | ICD-10-CM | POA: Diagnosis not present

## 2021-11-22 DIAGNOSIS — R194 Change in bowel habit: Secondary | ICD-10-CM | POA: Diagnosis not present

## 2021-11-22 DIAGNOSIS — F419 Anxiety disorder, unspecified: Secondary | ICD-10-CM | POA: Diagnosis not present

## 2021-11-22 DIAGNOSIS — I10 Essential (primary) hypertension: Secondary | ICD-10-CM | POA: Diagnosis not present

## 2021-11-22 DIAGNOSIS — E039 Hypothyroidism, unspecified: Secondary | ICD-10-CM | POA: Diagnosis not present

## 2021-11-28 DIAGNOSIS — F4323 Adjustment disorder with mixed anxiety and depressed mood: Secondary | ICD-10-CM | POA: Diagnosis not present

## 2021-12-04 DIAGNOSIS — F4323 Adjustment disorder with mixed anxiety and depressed mood: Secondary | ICD-10-CM | POA: Diagnosis not present

## 2021-12-11 DIAGNOSIS — F4323 Adjustment disorder with mixed anxiety and depressed mood: Secondary | ICD-10-CM | POA: Diagnosis not present

## 2021-12-18 DIAGNOSIS — F4323 Adjustment disorder with mixed anxiety and depressed mood: Secondary | ICD-10-CM | POA: Diagnosis not present

## 2022-01-08 DIAGNOSIS — F4323 Adjustment disorder with mixed anxiety and depressed mood: Secondary | ICD-10-CM | POA: Diagnosis not present

## 2022-01-17 DIAGNOSIS — I10 Essential (primary) hypertension: Secondary | ICD-10-CM | POA: Diagnosis not present

## 2022-01-17 DIAGNOSIS — E039 Hypothyroidism, unspecified: Secondary | ICD-10-CM | POA: Diagnosis not present

## 2022-01-17 DIAGNOSIS — F5101 Primary insomnia: Secondary | ICD-10-CM | POA: Diagnosis not present

## 2022-01-17 DIAGNOSIS — F419 Anxiety disorder, unspecified: Secondary | ICD-10-CM | POA: Diagnosis not present

## 2022-02-04 DIAGNOSIS — H53143 Visual discomfort, bilateral: Secondary | ICD-10-CM | POA: Diagnosis not present

## 2022-02-15 DIAGNOSIS — L509 Urticaria, unspecified: Secondary | ICD-10-CM | POA: Diagnosis not present

## 2022-03-01 DIAGNOSIS — E039 Hypothyroidism, unspecified: Secondary | ICD-10-CM | POA: Diagnosis not present

## 2022-04-22 ENCOUNTER — Other Ambulatory Visit: Payer: Self-pay | Admitting: Obstetrics and Gynecology

## 2022-04-22 DIAGNOSIS — Z1231 Encounter for screening mammogram for malignant neoplasm of breast: Secondary | ICD-10-CM

## 2022-04-22 DIAGNOSIS — E039 Hypothyroidism, unspecified: Secondary | ICD-10-CM | POA: Diagnosis not present

## 2022-04-25 DIAGNOSIS — F419 Anxiety disorder, unspecified: Secondary | ICD-10-CM | POA: Diagnosis not present

## 2022-04-25 DIAGNOSIS — I10 Essential (primary) hypertension: Secondary | ICD-10-CM | POA: Diagnosis not present

## 2022-04-25 DIAGNOSIS — E039 Hypothyroidism, unspecified: Secondary | ICD-10-CM | POA: Diagnosis not present

## 2022-04-25 DIAGNOSIS — M79604 Pain in right leg: Secondary | ICD-10-CM | POA: Diagnosis not present

## 2022-05-01 ENCOUNTER — Ambulatory Visit: Payer: BC Managed Care – PPO | Admitting: Cardiology

## 2022-06-05 ENCOUNTER — Ambulatory Visit
Admission: RE | Admit: 2022-06-05 | Discharge: 2022-06-05 | Disposition: A | Discharge status: Home / Self Care | Payer: BC Managed Care – PPO | Source: Ambulatory Visit | Attending: Obstetrics and Gynecology | Admitting: Obstetrics and Gynecology

## 2022-06-05 DIAGNOSIS — Z1231 Encounter for screening mammogram for malignant neoplasm of breast: Secondary | ICD-10-CM | POA: Diagnosis not present

## 2022-06-06 DIAGNOSIS — Z862 Personal history of diseases of the blood and blood-forming organs and certain disorders involving the immune mechanism: Secondary | ICD-10-CM | POA: Diagnosis not present

## 2022-06-06 DIAGNOSIS — I10 Essential (primary) hypertension: Secondary | ICD-10-CM | POA: Diagnosis not present

## 2022-06-27 NOTE — Progress Notes (Signed)
52 y.o. [id number] Married White or Caucasian Not Hispanic or Latino female here for annual exam.  She would like talk about menopause. She is having some mood changes, went on Wellbutrin, not sure if its helping. Previously tried lexapro, but it decreased her libido.  Not currently having any significant vasomotor symptoms. Sexually active, mild dryness.  Period Cycle (Days): 28 (but she skipped last month and still hasn't had a period.) Period Duration (Days): 5 Menstrual Flow: Heavy Menstrual Control: Tampon, Maxi pad Menstrual Control Change Freq (Hours): 1 Dysmenorrhea: (!) Moderate Dysmenorrhea Symptoms: Cramping Cycles have been heavy for 2 days of her cycle for years, she isn't anemic, but is on iron.    In 2019 she developed a DVT while on OCP's after a long flight.   H/o C/S x 3 last with tubal ligation. At 19 she had a laparotomy with ovarian cystectomy.  She was started on medication for HTN in the last year. She has been having issues with feeling light headed and at other times feeling like her body is heavy.   Patient's last menstrual period was 05/03/2022.          Sexually active: Yes.    The current method of family planning is tubal ligation.    Exercising: Yes.     Walking  Smoker:  no  Health Maintenance: Pap:  06/27/21 WNL Hr HPV Neg 03/23/14 WNL History of abnormal Pap:  yes follow up was normal  MMG:  06/05/22 Bi-rads 1 neg  BMD:   none  Colonoscopy: 2018 followed by her pcp, f/u in 10 years  TDaP:  08/24/15 Gardasil: none    reports that she has never smoked. She has never used smokeless tobacco. She reports current alcohol use. She reports that she does not use drugs. She is an Electrical engineer for [employer]. Sons are 23 and 21. Her daughter is almost 56.  Past Medical History:  Diagnosis Date   DVT (deep venous thrombosis)    Fibrocystic breast    Gallstones    GERD (gastroesophageal reflux disease)    Hypothyroid    OSA (obstructive sleep apnea)     home sleep study showing mild obstructive sleep apnea with an AHI of 8.9/h and lowest O2 saturations 87% and RDI of 12/hr    Past Surgical History:  Procedure Laterality Date   CESAREAN SECTION     x3   CHOLECYSTECTOMY N/A 09/16/2014   Procedure: LAPAROSCOPIC CHOLECYSTECTOMY WITH INTRAOPERATIVE CHOLANGIOGRAM;  Surgeon: Harriette Bouillon, MD;  Location: [facility];  Service: General;  Laterality: N/A;   OVARIAN CYST SURGERY      Current Outpatient Medications  Medication Sig Dispense Refill   buPROPion (WELLBUTRIN XL) 300 MG 24 hr tablet Take 300 mg by mouth every morning.     levothyroxine (SYNTHROID) 75 MCG tablet (d.a.w-1) 1 tablet in the morning on an empty stomach     metoprolol succinate (TOPROL XL) 25 MG 24 hr tablet Take 1 tablet (25 mg total) by mouth at bedtime. 90 tablet 3   No current facility-administered medications for this visit.    Family History  Problem Relation Age of Onset   Breast cancer Neg Hx     Review of Systems  All other systems reviewed and are negative.   Exam:   BP 122/78   Pulse 65   Ht  (1.549 m)   Wt 133 lb (60.3 kg)   LMP 05/03/2022   SpO2 100%   BMI 25.13 kg/m  Weight change: @ Height:   Height:  (154.9 cm)  Ht Readings from Last 3 Encounters:  07/03/22  (1.549 m)  11/01/21  (1.549 m)  09/25/21  (1.549 m)    General appearance: alert, cooperative and appears stated age Head: Normocephalic, without obvious abnormality, atraumatic Neck: no adenopathy, supple, symmetrical, trachea midline and thyroid normal to inspection and palpation Lungs: clear to auscultation bilaterally Cardiovascular: regular rate and rhythm Breasts: normal appearance, no masses or tenderness Abdomen: soft, non-tender; non distended,  no masses,  no organomegaly Extremities: extremities normal, atraumatic, no cyanosis or edema Skin: Skin color, texture, turgor normal. No rashes or lesions Lymph nodes: Cervical,  supraclavicular, and axillary nodes normal. No abnormal inguinal nodes palpated Neurologic: Grossly normal   Pelvic: External genitalia:  no lesions              Urethra:  normal appearing urethra with no masses, tenderness or lesions              Bartholins and Skenes: normal                 Vagina: normal appearing vagina with normal color and discharge, no lesions              Cervix: no lesions               Bimanual Exam:  Uterus:  normal size, contour, position, consistency, mobility, non-tender              Adnexa: no mass, fullness, tenderness               Rectovaginal: Confirms               Anus:  normal sphincter tone, no lesions  Carolynn Serve, CMA chaperoned for the exam.  1. Well woman exam Discussed breast self exam Discussed calcium and vit D intake Mammogram, pap and colonoscopy are UTD  2. Abnormal uterine bleeding (AUB) Perimenopausal, will call if she gets to 3 months without a cycle -She also has a h/o menorrhagia, we have discussed the mirena IUD. She previously had a mirena and did well. Last attempt at placement at another office was unsuccessful.  -If she wants an IUD, would place with u/s guidance and pretreat with cytotec.   3. Perimenopausal Discussed symptoms. -She is currently having anxiety. Is going to f/u with her primary.

## 2022-07-03 ENCOUNTER — Encounter: Payer: Self-pay | Admitting: Obstetrics and Gynecology

## 2022-07-03 ENCOUNTER — Ambulatory Visit (INDEPENDENT_AMBULATORY_CARE_PROVIDER_SITE_OTHER): Payer: BC Managed Care – PPO | Admitting: Obstetrics and Gynecology

## 2022-07-03 VITALS — BP 122/78 | HR 65 | Ht 61.0 in | Wt 133.0 lb

## 2022-07-03 DIAGNOSIS — Z01419 Encounter for gynecological examination (general) (routine) without abnormal findings: Secondary | ICD-10-CM

## 2022-07-03 DIAGNOSIS — N882 Stricture and stenosis of cervix uteri: Secondary | ICD-10-CM

## 2022-07-03 DIAGNOSIS — N951 Menopausal and female climacteric states: Secondary | ICD-10-CM

## 2022-07-03 DIAGNOSIS — N920 Excessive and frequent menstruation with regular cycle: Secondary | ICD-10-CM

## 2022-07-03 DIAGNOSIS — I10 Essential (primary) hypertension: Secondary | ICD-10-CM | POA: Insufficient documentation

## 2022-07-03 DIAGNOSIS — N939 Abnormal uterine and vaginal bleeding, unspecified: Secondary | ICD-10-CM

## 2022-07-03 NOTE — Patient Instructions (Signed)

## 2022-07-04 NOTE — Telephone Encounter (Signed)
Staff message to Tamera Punt to notify me once benefits have been reviewed and patient notified.

## 2022-07-04 NOTE — Telephone Encounter (Signed)
The patient would like a mirena IUD for treatment of menorrhagia. She has a h/o cervical stenosis and will need the IUD placed with u/s guidance. She should also be treated with cytotec 200 mcg, place 2 tablets vaginally 6-12 hours prior to IUD insertion. The order for the IUD and the U/S have been placed. She needs to know the out of pocket cost in order to schedule it. If she does schedule it, please call in the cytotec. Please have someone call her with the cost and to schedule the appointment. Thank you!

## 2022-07-10 MED ORDER — MISOPROSTOL 200 MCG PO TABS: ORAL_TABLET | ORAL | 0 refills | Status: DC

## 2022-07-10 NOTE — Telephone Encounter (Signed)
Spoke with patient, advised per Dr. Oscar La.  Patient notified of benefits per [place]. Patient request to proceed with scheduling. US guided IUD insertion scheduled for 08/22/22 at 12pm with Dr. Oscar La. Rx for cytotec to verified pharmacy. Advised to take Motrin 800 mg with food and water one hour before procedure. Patient verbalizes understanding and is agreeable.

## 2022-07-11 NOTE — Telephone Encounter (Signed)
New PUS order placed.    Routing to provider for final review. Patient is agreeable to disposition. Will close encounter.

## 2022-07-13 ENCOUNTER — Other Ambulatory Visit: Payer: Self-pay | Admitting: Cardiology

## 2022-07-18 DIAGNOSIS — I951 Orthostatic hypotension: Secondary | ICD-10-CM | POA: Diagnosis not present

## 2022-07-18 DIAGNOSIS — I10 Essential (primary) hypertension: Secondary | ICD-10-CM | POA: Diagnosis not present

## 2022-07-18 DIAGNOSIS — F411 Generalized anxiety disorder: Secondary | ICD-10-CM | POA: Diagnosis not present

## 2022-07-18 DIAGNOSIS — E039 Hypothyroidism, unspecified: Secondary | ICD-10-CM | POA: Diagnosis not present

## 2022-08-22 ENCOUNTER — Other Ambulatory Visit: Payer: BC Managed Care – PPO | Admitting: Obstetrics and Gynecology

## 2022-08-22 ENCOUNTER — Other Ambulatory Visit: Payer: BC Managed Care – PPO

## 2022-09-25 DIAGNOSIS — E785 Hyperlipidemia, unspecified: Secondary | ICD-10-CM | POA: Diagnosis not present

## 2022-10-25 ENCOUNTER — Telehealth: Payer: Self-pay | Admitting: *Deleted

## 2022-10-25 NOTE — Telephone Encounter (Signed)
Patient has order for US guided Mirena IUD insertion placed by Dr. Oscar La on 07/11/22. Patient scheduled for 08/22/22, cx < 24hrs.   Call placed to patient, left detailed message, ok per dpr. Advised f/u on US guided Mirena IUD. Return call to [place], [place] at [phone], option 5 to provide update.

## 2023-01-13 ENCOUNTER — Ambulatory Visit: Payer: BC Managed Care – PPO | Admitting: Obstetrics and Gynecology

## 2023-01-13 ENCOUNTER — Encounter: Payer: Self-pay | Admitting: Obstetrics and Gynecology

## 2023-01-13 VITALS — BP 110/80 | HR 83 | Temp 97.4°F

## 2023-01-13 DIAGNOSIS — N946 Dysmenorrhea, unspecified: Secondary | ICD-10-CM | POA: Diagnosis not present

## 2023-01-13 DIAGNOSIS — N8003 Adenomyosis of the uterus: Secondary | ICD-10-CM | POA: Diagnosis not present

## 2023-01-13 DIAGNOSIS — N938 Other specified abnormal uterine and vaginal bleeding: Secondary | ICD-10-CM

## 2023-01-13 DIAGNOSIS — R102 Pelvic and perineal pain: Secondary | ICD-10-CM | POA: Diagnosis not present

## 2023-01-13 DIAGNOSIS — N921 Excessive and frequent menstruation with irregular cycle: Secondary | ICD-10-CM | POA: Diagnosis not present

## 2023-01-13 NOTE — Progress Notes (Unsigned)
   Acute Office Visit  Subjective:    Patient ID: Katie Coffey, female    DOB: 1970/06/16, 52 y.o.   MRN: [id number]   HPI 52 y.o. presents today for pelvic cramping (Pt c/o of pelvic cramping, noticing some light blood when wiping. Not time for cycle) .Patient with severe debilitating pain.  She is using motrin 3-4x a day for the pain and it is not helping. Worse over the past 3-4 months  Affecting her quality of life and her family members have suggested for her to have a hysterectomy and see her GYN as soon as possible. She is missing work an she cannot enjoy spending time doing the things she loves and avoids going out. She is spotting between her periods. H/o LTCS x3 H/o blood clot with PE on ocp's and she cannot take any hormones. Pain is similar to her menstrual cramps but is now outside of the period and daily.  Blood pressure 110/80, pulse 83, temperature (!) 97.4 F (36.3 C), temperature source Oral, last menstrual period 12/29/2022, SpO2 99%.   Patient's last menstrual period was 12/29/2022 (exact date). Period Duration (Days): 5-6 Period Pattern: Regular Menstrual Flow: Heavy (heavy for 2 days) Menstrual Control: Tampon, Maxi pad Dysmenorrhea: (!) Severe Dysmenorrhea Symptoms: Cramping  Review of Systems     Objective:    OBGyn Exam  BP 110/80   Pulse 83   Temp (!) 97.4 F (36.3 C) (Oral)   LMP 12/29/2022 (Exact Date)   SpO2 99%  Wt Readings from Last 3 Encounters:  07/03/22 133 lb (60.3 kg)  11/01/21 140 lb 12.8 oz (63.9 kg)  09/25/21 135 lb 3.2 oz (61.3 kg)    Severe dysmenorrhea, DUB, likely adenomyosis, h/o LTCSx3  Assessment & Plan:  Pelvic pain -     Urinalysis,Complete w/RFL Culture  All options discussed.  Patient is not a candidate for an ablation with 3 prior ltcs, she cannot take hormones.  Discussed short course of progesterone to see if this controls the bleeding, but would likely not manage the pain.  She is frustrated with the pain and  would like a definitive mangement and would like the [facility].  The procedure was discussed in detail including the r/b/a/I and postoperative care and restrictions.   She will need an EMB and [place] completed before the procedure.  She voiced understanding. She will return for this this week.  Case placed as urgent due to patient pain level and need for surgery.  Discussed diagnosis and management of adenomysosis and treatment options but majority will go on to surgery due to the lack of response from progesterone and continued dysmenorrhea. A surgical request was sent.  30 minutes spent on reviewing records, imaging,  and one on one patient time and counseling patient and documentation Dr. Judith Blonder

## 2023-01-15 ENCOUNTER — Other Ambulatory Visit: Payer: Self-pay

## 2023-01-15 MED ORDER — NITROFURANTOIN MONOHYD MACRO 100 MG PO CAPS: 100.0000 mg | ORAL_CAPSULE | Freq: Two times a day (BID) | ORAL | 0 refills | Status: AC

## 2023-01-15 MED ORDER — FLUCONAZOLE 150 MG PO TABS: 150.0000 mg | ORAL_TABLET | Freq: Once | ORAL | 0 refills | Status: AC

## 2023-01-16 ENCOUNTER — Other Ambulatory Visit (HOSPITAL_COMMUNITY)
Admission: RE | Admit: 2023-01-16 | Discharge: 2023-01-16 | Disposition: A | Discharge status: Home / Self Care | Payer: BC Managed Care – PPO | Source: Ambulatory Visit | Attending: Obstetrics and Gynecology | Admitting: Obstetrics and Gynecology

## 2023-01-16 ENCOUNTER — Encounter: Payer: Self-pay | Admitting: Obstetrics and Gynecology

## 2023-01-16 ENCOUNTER — Ambulatory Visit (INDEPENDENT_AMBULATORY_CARE_PROVIDER_SITE_OTHER): Payer: BC Managed Care – PPO | Admitting: Obstetrics and Gynecology

## 2023-01-16 ENCOUNTER — Ambulatory Visit (INDEPENDENT_AMBULATORY_CARE_PROVIDER_SITE_OTHER): Payer: BC Managed Care – PPO

## 2023-01-16 VITALS — BP 124/84 | HR 65

## 2023-01-16 DIAGNOSIS — N938 Other specified abnormal uterine and vaginal bleeding: Secondary | ICD-10-CM

## 2023-01-16 DIAGNOSIS — N8003 Adenomyosis of the uterus: Secondary | ICD-10-CM | POA: Insufficient documentation

## 2023-01-16 DIAGNOSIS — N840 Polyp of corpus uteri: Secondary | ICD-10-CM | POA: Insufficient documentation

## 2023-01-16 DIAGNOSIS — Z124 Encounter for screening for malignant neoplasm of cervix: Secondary | ICD-10-CM | POA: Diagnosis not present

## 2023-01-16 DIAGNOSIS — R102 Pelvic and perineal pain unspecified side: Secondary | ICD-10-CM

## 2023-01-16 DIAGNOSIS — R87610 Atypical squamous cells of undetermined significance on cytologic smear of cervix (ASC-US): Secondary | ICD-10-CM | POA: Insufficient documentation

## 2023-01-16 DIAGNOSIS — N921 Excessive and frequent menstruation with irregular cycle: Secondary | ICD-10-CM

## 2023-01-16 DIAGNOSIS — N946 Dysmenorrhea, unspecified: Secondary | ICD-10-CM

## 2023-01-16 DIAGNOSIS — D219 Benign neoplasm of connective and other soft tissue, unspecified: Secondary | ICD-10-CM | POA: Insufficient documentation

## 2023-01-16 DIAGNOSIS — D259 Leiomyoma of uterus, unspecified: Secondary | ICD-10-CM | POA: Diagnosis not present

## 2023-01-16 LAB — URINALYSIS, COMPLETE W/RFL CULTURE
Bilirubin Urine: NEGATIVE
Casts: NONE SEEN /[LPF]
Crystals: NONE SEEN /HPF
Glucose, UA: NEGATIVE
Ketones, ur: NEGATIVE
Leukocyte Esterase: NEGATIVE
Nitrites, Initial: NEGATIVE
Specific Gravity, Urine: 1.029 (ref 1.001–1.035)
WBC, UA: NONE SEEN /[HPF] (ref 0–5)
Yeast: NONE SEEN /HPF
pH: 5.5 (ref 5.0–8.0)

## 2023-01-16 LAB — URINE CULTURE
MICRO NUMBER:: 15681745
SPECIMEN QUALITY:: ADEQUATE

## 2023-01-16 LAB — CULTURE INDICATED

## 2023-01-16 NOTE — Progress Notes (Signed)
Acute Office Visit  Subjective:    Patient ID: Katie Coffey, female    DOB: 10-Oct-1970, 52 y.o.   MRN: [id number]   HPI 52 y.o. presents today for ultrasound & endometrial biopsy (Ultrasound & endometrial biopsy//jj) .Patient with severe debilitating pain.  She is using motrin 3-4x a day for the pain and it is not helping. Worse over the past 3-4 months  Affecting her quality of life and her family members have suggested for her to have a hysterectomy and see her GYN as soon as possible. She is missing work an she cannot enjoy spending time doing the things she loves and avoids going out. She is spotting between her periods. H/o LTCS x3, Has tried the mirena IUD with no success H/o blood clot with PE on ocp's and she cannot take any hormones. Pain is similar to her menstrual cramps but is now outside of the period and daily. Patient is frustrated with her bleeding and pain that is excruciating.  She is here today for ultrasound endometrial absent Pap smear collection.  She would like to move forward with a robotic hysterectomy as soon as possible.  She has started the [facility] and is still having the menstrual intense cramp-like pain.  Today's ultrasound shows a 8.72 cm uterus, 2 fibroids the largest measuring 1.75 cm and a 2.8 cm complex mostly simple avascular cyst.  Endometrial lining at 7.9 mm questionable area seen with a feeder vessel measuring 11 x 7.7 mm   PROCEDURE: EMB Consent obtained for the procedure.  A bivalve speculum was placed in the vagina.  The cervix was grasped with a single tooth tenaculum.  Uterus sounded to 8 cm.  Pipelle was inserted and rotated.  Adequate specimen was obtained and sent to pathology.  All instruments were removed.  Patient tolerated the procedure well.  To notify patient of the results.   Blood pressure 124/84, pulse 65, last menstrual period 12/29/2022, SpO2 99%.   Patient's last menstrual period was 12/29/2022 (exact date). Period Duration  (Days): 5-6 Period Pattern: Regular Menstrual Flow: Heavy (heavy 2-3 days) Menstrual Control: Tampon, Maxi pad Dysmenorrhea: (!) Severe Dysmenorrhea Symptoms: Cramping, Headache  Review of Systems     Objective:    Physical Exam Genitourinary:     Vulva and urethral meatus normal.     No lesions in the vagina.     Genitourinary Comments: Boggy uterus c/w adenomyosis     Right Labia: No rash, lesions or skin changes.    Left Labia: No lesions, skin changes or rash.    No vaginal discharge or tenderness.     No vaginal prolapse present.    No vaginal atrophy present.     Right Adnexa: not tender, not palpable and no mass present.    Left Adnexa: not tender, not palpable and no mass present.    No cervical motion tenderness or discharge.     Uterus is tender and irregular.     Uterus is not enlarged.     Uterus is anteverted.  Vitals and nursing note reviewed. Exam conducted with a chaperone present.     BP 124/84   Pulse 65   LMP 12/29/2022 (Exact Date)   SpO2 99%  Wt Readings from Last 3 Encounters:  07/03/22 133 lb (60.3 kg)  11/01/21 140 lb 12.8 oz (63.9 kg)  09/25/21 135 lb 3.2 oz (61.3 kg)      Component Value Date/Time   DIAGPAP  06/27/2021 1052    - Negative  for Intraepithelial Lesions or Malignancy (NILM)   DIAGPAP - Benign reactive/reparative changes 06/27/2021 1052   HPVHIGH Negative 06/27/2021 1052   ADEQPAP  06/27/2021 1052    Satisfactory for evaluation; transformation zone component PRESENT.    High Risk HPV: Positive  Adequacy:  Satisfactory for evaluation, transformation zone component PRESENT  Diagnosis:  Atypical squamous cells of undetermined significance (ASC-US)  Severe dysmenorrhea, DUB, likely adenomyosis, h/o LTCSx3  Assessment & Plan:  Pelvic pain -     Surgical pathology  Fibroids -     Surgical pathology  Adenomyosis -     Surgical pathology  Endometrial polyp -     Surgical pathology  All options discussed.  Patient is  not a candidate for an ablation with 3 prior ltcs, she cannot take hormones.  Discussed short course of progesterone to see if this controls the bleeding, but would likely not manage the pain.  She is frustrated with the pain and would like a definitive mangement and would like the [facility].  The procedure was discussed in detail including the r/b/a/I and postoperative care and restrictions.   Discussed diagnosis and management of adenomysosis and treatment options but majority will go on to surgery due to the lack of response from progesterone and continued dysmenorrhea. A surgical request was sent.  EMB and [place] completed today.  Patient desires to schedule surgery asap. Pap smear collected Nu swab sent  30 minutes spent on reviewing records, imaging,  and one on one patient time and counseling patient and documentation Dr. Judith Blonder

## 2023-01-17 LAB — SURESWAB® ADVANCED VAGINITIS PLUS,TMA
C. trachomatis RNA, TMA: NOT DETECTED
CANDIDA SPECIES: NOT DETECTED
Candida glabrata: NOT DETECTED
N. gonorrhoeae RNA, TMA: NOT DETECTED
SURESWAB(R) ADV BACTERIAL VAGINOSIS(BV),TMA: NEGATIVE
TRICHOMONAS VAGINALIS (TV),TMA: NOT DETECTED

## 2023-01-17 LAB — SURGICAL PATHOLOGY

## 2023-01-20 LAB — CYTOLOGY - PAP
Comment: NEGATIVE
Diagnosis: UNDETERMINED — AB
High risk HPV: NEGATIVE

## 2023-01-30 DIAGNOSIS — F419 Anxiety disorder, unspecified: Secondary | ICD-10-CM | POA: Diagnosis not present

## 2023-01-30 DIAGNOSIS — I10 Essential (primary) hypertension: Secondary | ICD-10-CM | POA: Diagnosis not present

## 2023-01-30 DIAGNOSIS — F5101 Primary insomnia: Secondary | ICD-10-CM | POA: Diagnosis not present

## 2023-01-30 DIAGNOSIS — D509 Iron deficiency anemia, unspecified: Secondary | ICD-10-CM | POA: Diagnosis not present

## 2023-01-30 DIAGNOSIS — E039 Hypothyroidism, unspecified: Secondary | ICD-10-CM | POA: Diagnosis not present

## 2023-02-03 NOTE — Telephone Encounter (Signed)
Patient seen in office on 01/13/23 and 01/16/23.   Encounter closed.

## 2023-02-17 ENCOUNTER — Encounter: Payer: Self-pay | Admitting: Obstetrics and Gynecology

## 2023-02-28 ENCOUNTER — Encounter: Payer: Self-pay | Admitting: Obstetrics and Gynecology

## 2023-02-28 ENCOUNTER — Ambulatory Visit (INDEPENDENT_AMBULATORY_CARE_PROVIDER_SITE_OTHER): Payer: BC Managed Care – PPO | Admitting: Obstetrics and Gynecology

## 2023-02-28 ENCOUNTER — Encounter: Payer: Self-pay | Admitting: *Deleted

## 2023-02-28 VITALS — BP 122/72 | HR 64 | Ht 61.0 in | Wt 143.0 lb

## 2023-02-28 DIAGNOSIS — D219 Benign neoplasm of connective and other soft tissue, unspecified: Secondary | ICD-10-CM

## 2023-02-28 DIAGNOSIS — Z01818 Encounter for other preprocedural examination: Secondary | ICD-10-CM

## 2023-02-28 DIAGNOSIS — N8003 Adenomyosis of the uterus: Secondary | ICD-10-CM

## 2023-02-28 DIAGNOSIS — N921 Excessive and frequent menstruation with irregular cycle: Secondary | ICD-10-CM

## 2023-02-28 DIAGNOSIS — N840 Polyp of corpus uteri: Secondary | ICD-10-CM | POA: Diagnosis not present

## 2023-02-28 MED ORDER — OXYCODONE HCL 5 MG PO TABS: 5.0000 mg | ORAL_TABLET | ORAL | 0 refills | Status: DC | PRN

## 2023-02-28 MED ORDER — METOCLOPRAMIDE HCL 10 MG PO TABS: 10.0000 mg | ORAL_TABLET | Freq: Three times a day (TID) | ORAL | 0 refills | Status: DC | PRN

## 2023-02-28 MED ORDER — ENOXAPARIN SODIUM 40 MG/0.4ML IJ SOSY: 40.0000 mg | PREFILLED_SYRINGE | INTRAMUSCULAR | 0 refills | Status: DC

## 2023-02-28 MED ORDER — IBUPROFEN 800 MG PO TABS: 800.0000 mg | ORAL_TABLET | Freq: Three times a day (TID) | ORAL | 1 refills | Status: AC | PRN

## 2023-02-28 NOTE — Progress Notes (Signed)
PREOP H&P  Subjective:    Patient ID: Katie Coffey, female    DOB: [birth year], 52 y.o.   MRN: [id number]   HPI 52 y.o. presents today for Pre-op Exam (Hysterectomy) .Patient with severe debilitating pain.  She is using motrin 3-4x a day for the pain and it is not helping. Worse over the past 3-4 months  Affecting her quality of life and her family members have suggested for her to have a hysterectomy and see her GYN as soon as possible.  History of blood clot. She is missing work an she cannot enjoy spending time doing the things she loves and avoids going out. She is spotting between her periods. H/o LTCS x3, Has tried the mirena IUD with no success H/o blood clot with PE on ocp's and she cannot take any hormones. Pain is similar to her menstrual cramps but is now outside of the period and daily. Patient is frustrated with her bleeding and pain that is excruciating.  She is here today for ultrasound endometrial absent Pap smear collection.  She would like to move forward with a robotic hysterectomy as soon as possible.  She has started the [facility] and is still having the menstrual intense cramp-like pain. Noticed this weekend on her cycle several large clots  and had to take a shower because it was coming out so fast and would soak a pad if she put one on.  She is ready for a definitive treatment and a better quality of life with the [facility]. Today's ultrasound shows a 8.72 cm uterus, 2 fibroids the largest measuring 1.75 cm and a 2.8 cm complex mostly simple avascular cyst.  Endometrial lining at 7.9 mm questionable area seen with a feeder vessel measuring 11 x 7.7 mm    PATHOLOGY SURGICAL PATHOLOGY CASE: [id number] PATIENT: Katie Coffey Surgical Pathology Report     Clinical History: pelvic pain, fibroids, adenomyosis, endometrial polyp (cm)     FINAL MICROSCOPIC DIAGNOSIS:  A. ENDOMETRIUM, BIOPSY: -  Proliferative phase endometrium, negative for  atypia/hyperplasia.    GROSS DESCRIPTION:  Received in formalin are tan, hemorrhagic soft tissue fragments that are entirely submitted. Volume: 1.9 x 1.7 x 0.2 cm. (1 B)  (KW, 01/16/2023)  Final Diagnosis performed by Orene Desanctis DO.   Electronically signed 01/17/2023 Technical component performed at [employer]. [facility], [address], [place], [place] [address].  Professional component performed at [facility], [address], [place], [place] [address].  Immunohistochemistry Technical component (if applicable) was performed at [facility]. [address], [place], [place] [address].   IMMUNOHISTOCHEMISTRY DISCLAIMER (if applicable): Some of these immunohistochemical stains may have been developed and the performance characteristics determine by [facility]. Some may not have been cleared or approved by the U.S. Food and Drug Administration. The FDA has determined that such clearance or approval is not necessary. This test is used for clinical purposes. It should not be regarded as investigational or for research. This laboratory is certified under the Clinical Laboratory Improvement Amendments of 1988 (CLIA-88) as qualified to perform high complexity clinical laboratory testing.  The controls stained appropriately.   IHC stains are performed on formalin fixed, paraffin embedded tissue using a 3,3"diaminobenzidine (DAB) chromogen and Leica Bond Autostainer System. The staining intensity of the nucleus is score manually and is reported as the percentage of tumor cell nuclei demonstrating specific nuclear staining. The specimens are fixed in 10% Neutral Formalin for at least 6 hours  and up to 72hrs. These tests are validated on decalcified tissue. Results should be interpreted with caution given the possibility of false negative results on decalcified specimens. Antibody Clones are as follows ER-clone 40F,  PR-clone 16, Ki67- clone MM1. Some of these immunohistochemical stains may have been developed and the performance     Blood pressure 122/72, pulse 64, height 5\' 1"  (1.549 m), weight 143 lb (64.9 kg), last menstrual period 02/22/2023, SpO2 92%.   Patient's last menstrual period was 02/22/2023.    Review of Systems     Objective:    Physical Exam Genitourinary:     Vulva and urethral meatus normal.     No lesions in the vagina.     Genitourinary Comments: Boggy uterus c/w adenomyosis     Right Labia: No rash, lesions or skin changes.    Left Labia: No lesions, skin changes or rash.    No vaginal discharge or tenderness.     No vaginal prolapse present.    No vaginal atrophy present.     Right Adnexa: not tender, not palpable and no mass present.    Left Adnexa: not tender, not palpable and no mass present.    No cervical motion tenderness or discharge.     Uterus is tender and irregular.     Uterus is not enlarged.     Uterus is anteverted.  Vitals and nursing note reviewed. Exam conducted with a chaperone present.     BP 122/72 (BP Location: Left Arm, Patient Position: Sitting, Cuff Size: Small)   Pulse 64   Ht 5\' 1"  (1.549 m)   Wt 143 lb (64.9 kg)   LMP 02/22/2023   SpO2 92%   BMI 27.02 kg/m  Wt Readings from Last 3 Encounters:  02/28/23 143 lb (64.9 kg)  07/03/22 133 lb (60.3 kg)  11/01/21 140 lb 12.8 oz (63.9 kg)      Component Value Date/Time   DIAGPAP (A) 01/16/2023 1043    - Atypical squamous cells of undetermined significance (ASC-US)   DIAGPAP  06/27/2021 1052    - Negative for Intraepithelial Lesions or Malignancy (NILM)   DIAGPAP - Benign reactive/reparative changes 06/27/2021 1052   HPVHIGH Negative 01/16/2023 1043   HPVHIGH Negative 06/27/2021 1052   ADEQPAP  01/16/2023 1043    Satisfactory for evaluation; transformation zone component PRESENT.   ADEQPAP  06/27/2021 1052    Satisfactory for evaluation; transformation zone component PRESENT.     High Risk HPV: Positive  Adequacy:  Satisfactory for evaluation, transformation zone component PRESENT  Diagnosis:  Atypical squamous cells of undetermined significance (ASC-US) Past Medical History:  Diagnosis Date   DVT (deep venous thrombosis) (HCC)    Fibrocystic breast    Gallstones    GERD (gastroesophageal reflux disease)    Hypertension    Hypothyroid    OSA (obstructive sleep apnea)    home sleep study showing mild obstructive sleep apnea with an AHI of 8.9/h and lowest O2 saturations 87% and RDI of 12/hr   Past Surgical History:  Procedure Laterality Date   CESAREAN SECTION     x3   CHOLECYSTECTOMY N/A 09/16/2014   Procedure: LAPAROSCOPIC CHOLECYSTECTOMY WITH INTRAOPERATIVE CHOLANGIOGRAM;  Surgeon: Harriette Bouillon, MD;  Location: [facility];  Service: General;  Laterality: N/A;   OVARIAN CYST SURGERY     Social History   Socioeconomic History   Marital status: Married    Spouse name: Not on file   Number of children: Not on file  Years of education: Not on file   Highest education level: Not on file  Occupational History   Not on file  Tobacco Use   Smoking status: Never   Smokeless tobacco: Never  Substance and Sexual Activity   Alcohol use: Yes    Comment: Once a month.    Drug use: No   Sexual activity: Yes    Partners: Male    Birth control/protection: Surgical    Comment: btl  Other Topics Concern   Not on file  Social History Narrative   Not on file   Social Drivers of Health   Financial Resource Strain: Not on file  Food Insecurity: Not on file  Transportation Needs: Not on file  Physical Activity: Not on file  Stress: Not on file  Social Connections: Not on file   Allergies  Allergen Reactions   Other Hives    walnuts   S1S2 CTA B/L Soft, NT No c/c/e  Severe dysmenorrhea, DUB, likely adenomyosis, h/o LTCSx3 for preop for RLH, bilateral salpingectomy, cystoscopy  Assessment & Plan:  There are no diagnoses  linked to this encounter. The risk, benefits, alternatives of the procedure were discussed with patient including but not limited to the risk for bleeding, infection, injury to surrounding structures such as the bowel, vessels, bladder, and ureters were reviewed.  The risk for blood products and risk for an additional procedure, and risk for laparotomy in an extreme event was discussed.  The risk for blood clots are also discussed.  Postoperative care instructions were discussed and reviewed with patient.  Postoperative medications were sent to her pharmacy and patient was instructed to pick up prior to surgery.  She will have a driver to take her there and take her home and she agrees that that person will stay with her overnight. Post care instructions were also put in the wrap-up section of this note for patient, as a reminder of care instructions. All questions were answered and patient agrees and would like to proceed with the procedure.     Earley Favor

## 2023-02-28 NOTE — Patient Instructions (Signed)
Robotic Laparoscopic Hysterectomy, Care After  The following information offers guidance on how to care for yourself after your procedure. Your health care provider may also give you more specific instructions. If you have problems or questions, contact your health care provider. What can I expect after the procedure? After the procedure, it is common to have: Pain, bruising, and numbness around your incisions. Tiredness (fatigue). Poor appetite. Less interest in sex. Vaginal discharge or bleeding. You will need to use a sanitary pad after this procedure.  HEAVY BLEEDING LIKE A PERIOD IS NOT NORMAL.  PLEASE CALL YOUR PROVIDER IF SOAKING A PAD. Feelings of sadness or other emotions. If your ovaries were also removed, it is also common to have symptoms of menopause, such as hot flashes, night sweats, and lack of sleep (insomnia).  Ovaries should stay in if at all possible until at least the age of 30. Follow these instructions at home: Medicines Take over-the-counter and prescription medicines only as told by your health care provider. Ask your health care provider if the medicine prescribed to you: Requires you to avoid driving or using machinery. Can cause constipation. You may need to take these actions to prevent or treat constipation: Drink enough fluid to keep your urine pale yellow. Take over-the-counter or prescription medicines. Eat foods that are high in fiber, such as beans, whole grains, and fresh fruits and vegetables. Limit foods that are high in fat and processed sugars, such as fried or sweet foods.  Also, avoid spicy foods.  NAUSEA IS COMMON THE FIRST NIGHT OF SURGERY.  IF IT LASTS BEYOND 24 HOURS, CALL YOUR PROVIDER.  NAUSEA MEDICATION WAS GIVEN AT YOUR PREOP APPOINTMENT THAT YOU CAN TAKE AFTER SURGERY. Incision care  Follow instructions from your health care provider about how to take care of your incisions. Make sure you: LEAVE INCISION OPEN AND DRY-NO BANDAGES Leave  stitches (sutures), skin glue, or adhesive strips in place UNTIL 2 WEEKS THEN REMOVE IN THE SHOWER.  If adhesive strip edges start to loosen and curl up, you may trim the loose edges. Check your incision areas every day for signs of infection. Check for: More redness, swelling, or pain. Fluid or blood. Warmth. Pus or a bad smell. Activity  Rest as told by your health care provider. Avoid sitting for a long time without moving. Get up to take short walks every 1-2 hours. This is important to improve blood flow and breathing. Ask for help if you feel weak or unsteady. Return to your normal activities as told by your health care provider. Ask your health care provider what activities are safe for you. Do not lift anything that is heavier than 10 lb (4.5 kg), or the limit that you are told, for 8 WEEKS after surgery or until your health care provider says that it is safe. If you were given a sedative during the procedure, it can affect you for several hours. Do not drive or operate machinery until your health care provider says that it is safe. Lifestyle Do not use any products that contain nicotine or tobacco. These products include cigarettes, chewing tobacco, and vaping devices, such as e-cigarettes. These can delay healing after surgery. If you need help quitting, ask your health care provider. Do not drink alcohol until your health care provider approves. General instructions FOR 2 WEEKS AFTER SURGERY, THEN YOU MAY USE TUBS AND HOT TUBS Do not douche, use tampons, or have sex for at least 10 weeks, or as told by your health care  provider. If you struggle with physical or emotional changes after your procedure, speak with your health care provider or a therapist. Do not take baths, swim, or use a hot tub until your health care provider approves. You may only be allowed to take showers for 2 weeks. IF YOU HAVE BURNING WITH URINATION, PLEASE CALL YOUR DOCTOR. BLADDER INFECTIONS MAY OCCUR AFTER  SURGERY Try to have someone at home with you for the first 1-2 weeks to help with your daily chores. Wear compression stockings as told by your health care provider. These stockings help to prevent blood clots and reduce swelling in your legs. Keep all follow-up visits. This is important. Contact a health care provider if: You have any of these signs of infection: Chills or a fever 125f OR GREATER. More redness, swelling, or pain around an incision. Fluid or blood coming from an incision. Warmth coming from an incision. Pus or a bad smell coming from an incision. Burning with urination. Urinary frequency or cramping.   IF YOU HAVE THESE SYMPTOMS, PLEASE CALL THE OFFICE TO COME EVALUATE FOR A BLADDER INFECTION AT [phone] An incision opens. You feel dizzy or light-headed. You have pain or bleeding when you urinate, or you are unable to urinate. You have abnormal vaginal discharge. You have pain that does not get better with medicine. Get help right away if: You have a fever and your symptoms suddenly get worse. You have severe abdominal pain. You have chest pain or shortness of breath. You may have chest pain and shortness of breath from the CO2 gas for a few days after surgery.  This is very common.  Walking, Gas-X and motrin will usually help relieve this discomfort You faint. You have pain, swelling, or redness in your leg. You have heavy vaginal bleeding with blood clots, soaking through a sanitary pad in less than 1 hour. These symptoms may represent a serious problem that is an emergency. Do not wait to see if the symptoms will go away. Get medical help right away. Call your local emergency services (911 in the [place] Do not drive yourself to the hospital. Summary  CONSTIPATION MEDICATION AFTER SURGERY: COLACE, MOM, MIRALAX, GAS X are all helpful to have on hand, if needed.  FILL ALL POSTOP MEDICATION BEFORE SURGERY  After the procedure, it is common to have pain and bruising  around your incisions. Do not take baths, swim, or use a hot tub until your health care provider approves. Do not lift anything that is heavier than 8- 10 lb (4.5 kg), or the limit that you are told, for one month after surgery or until your health care provider says that it is safe. Tell your health care provider if you have any signs or symptoms of infection after the procedure. Get help right away if you have severe abdominal pain, chest pain, shortness of breath, or heavy bleeding from your vagina. This information is not intended to replace advice given to you by your  health care provider. Make sure you discuss any questions you have with your health care provider. Document Revised: 10/28/2019 Document Reviewed: 10/29/2019 Elsevier Patient Education  2024 [employer].

## 2023-03-10 ENCOUNTER — Encounter (HOSPITAL_BASED_OUTPATIENT_CLINIC_OR_DEPARTMENT_OTHER): Payer: Self-pay | Admitting: Obstetrics and Gynecology

## 2023-03-10 NOTE — Progress Notes (Signed)
Your procedure is scheduled on :  Friday,  03-21-2023  Report to [facility] AT  __7:00_ AM.   Call this number if you have problems the morning of surgery  :[phone]. Any questions prior to surgery call pre-op nurse, Janalynn Eder :  [phone]   OUR ADDRESS IS [address].  WE ARE LOCATED IN [facility]  PLEASE BRING YOUR INSURANCE CARD AND PHOTO ID DAY OF SURGERY.                                     REMEMBER:  Do not eat any food or drink any liquids after midnight night before surgery, absolutely nothing by mouth.  This includes no candy/  gum/  mints.   Please brush your teeth morning of surgery and rinse mouth out.   _____________________________________________________________________     TAKE ONLY THESE MEDICATIONS MORNING OF SURGERY: May take medication with sips of water Synthroid                                       DO NOT WEAR JEWERLY/  METAL/  PIERCINGS (INCLUDING NO PLASTIC PIERCINGS) DO NOT WEAR LOTIONS, POWDERS, PERFUMES OR NAIL POLISH ON YOUR FINGERNAILS. TOENAIL POLISH IS OK TO WEAR. DO NOT SHAVE FOR 48 HOURS PRIOR TO DAY OF SURGERY.  CONTACTS, GLASSES, OR DENTURES MAY NOT BE WORN TO SURGERY.  REMEMBER: NO SMOKING, VAPING ,  DRUGS OR ALCOHOL FOR 24 HOURS BEFORE YOUR SURGERY.                                    [facility] IS NOT RESPONSIBLE  FOR ANY BELONGINGS.                                                                    Marland Kitchen           [place] - Preparing for Surgery Before surgery, you can play an important role.  Because skin is not sterile, your skin needs to be as free of germs as possible.  You can reduce the number of germs on your skin by washing with CHG (chlorahexidine gluconate) soap before surgery.  CHG is an antiseptic cleaner which kills germs and bonds with the skin to continue killing germs even after washing. Please DO NOT use if you have an allergy to CHG or antibacterial soaps.  If  your skin becomes reddened/irritated stop using the CHG and inform your nurse when you arrive at Short Stay. Do not shave (including legs and underarms) for at least 48 hours prior to the first CHG shower.  You may shave your face/neck. Please follow these instructions carefully:  1.  Shower with CHG Soap the night before surgery and the  morning of Surgery.  2.  If you choose to wash your hair, wash your hair first as usual with your  normal  shampoo.  3.  After you shampoo, rinse your hair and body thoroughly to remove  the  shampoo.                                        4.  Use CHG as you would any other liquid soap.  You can apply chg directly  to the skin and wash , chg soap provided, night before and morning of your surgery.  5.  Apply the CHG Soap to your body ONLY FROM THE NECK DOWN.   Do not use on face/ open                           Wound or open sores. Avoid contact with eyes, ears mouth and genitals (private parts).                       Wash face,  Genitals (private parts) with your normal soap.             6.  Wash thoroughly, paying special attention to the area where your surgery  will be performed.  7.  Thoroughly rinse your body with warm water from the neck down.  8.  DO NOT shower/wash with your normal soap after using and rinsing off  the CHG Soap.             9.  Pat yourself dry with a clean towel.            10.  Wear clean pajamas.            11.  Place clean sheets on your bed the night of your first shower and do not  sleep with pets. Day of Surgery : Do not apply any lotions/ powders the morning of surgery.  Please wear clean clothes to the hospital/surgery center.  IF YOU HAVE ANY SKIN IRRITATION OR PROBLEMS WITH THE SURGICAL SOAP, PLEASE GET A BAR OF GOLD DIAL SOAP AND SHOWER THE NIGHT BEFORE YOUR SURGERY AND THE MORNING OF YOUR SURGERY. PLEASE LET THE NURSE KNOW MORNING OF YOUR SURGERY IF YOU HAD ANY PROBLEMS WITH THE SURGICAL SOAP.   YOUR SURGEON MAY HAVE  REQUESTED EXTENDED RECOVERY TIME AFTER YOUR SURGERY. IT COULD BE A  JUST A FEW HOURS  UP TO AN OVERNIGHT STAY.  YOUR SURGEON SHOULD HAVE DISCUSSED THIS WITH YOU PRIOR TO YOUR SURGERY. IN THE EVENT YOU NEED TO STAY OVERNIGHT PLEASE REFER TO THE FOLLOWING GUIDELINES. YOU MAY HAVE UP TO 4 VISITORS  MAY VISIT IN THE EXTENDED RECOVERY ROOM UNTIL 800 PM ONLY.  ONE  VISITOR AGE 39 AND OVER MAY SPEND THE NIGHT AND MUST BE IN EXTENDED RECOVERY ROOM NO LATER THAN 800 PM . YOUR DISCHARGE TIME AFTER YOU SPEND THE NIGHT IS 900 AM THE MORNING AFTER YOUR SURGERY. YOU MAY PACK A SMALL OVERNIGHT BAG WITH TOILETRIES FOR YOUR OVERNIGHT STAY IF YOU WISH.  REGARDLESS OF IF YOU STAY OVER NIGHT OR ARE DISCHARGED THE SAME DAY YOU WILL BE REQUIRED TO HAVE A RESPONSIBLE ADULT (18 YRS OLD OR OLDER) STAY WITH YOU FOR AT LEAST THE FIRST 24 HOURS WHEN HOME  YOUR PRESCRIPTION MEDICATIONS WILL BE PROVIDED DURING [facility] STAY.  ________________________________________________________________________

## 2023-03-10 NOTE — Progress Notes (Addendum)
Spoke w/ via phone for pre-op interview--- pt Lab needs dos----     urine preg    Lab results------ lap appt 03-17-2023 @ 1015 getting CBC/ BMP/ T&S/ EKG COVID test -----patient states asymptomatic no test needed Arrive at ------- 0700 on 03-21-2023 NPO after MN with exception sip of water w/ med Med rec completed Medications to take morning of surgery ----- synthroid Diabetic medication ----- n/a Patient instructed no nail polish to be worn day of surgery Patient instructed to bring photo id and insurance card day of surgery Patient aware to have Driver (ride ) / caregiver    for 24 hours after surgery - husband, jason Patient Special Instructions ----- will pick up bag w/ hibiclens and written instructions at lab appt. Asked to call with any questions.  Pt did ask about lovenox injection prescription Dr Karma Greaser had written she question if it started after surgery and how to do injection.  Pt verbalized understanding to call Dr Karma Greaser office to verify instructions and go by her pharmacy in person and her pharmacist show her how to do injection. Pre-Op special Instructions ----- n/a Patient verbalized understanding of instructions that were given at this phone interview. Patient denies chest pain, sob, fever, cough at the interview.

## 2023-03-11 ENCOUNTER — Telehealth: Payer: Self-pay | Admitting: *Deleted

## 2023-03-11 NOTE — Telephone Encounter (Signed)
 Call returned to patient. Patient received Rx for Lovenox  IM, is unsure of instructions. Patient states Rx given for Hx of DVT. Reviewed with [facility] Pre-op yesterday and was instructed to f/u with surgeon.   Patient is scheduled for [facility], BS, Cysto on 03/21/23.  Reviewed pre-op OV notes dates 02/28/23.   Advised patient not to take Lovenox  at this time, will f/u with covering provider and f/u with recommendations. Patient verbalizes understanding and is agreeable.   Dr. Nikki -can you review and advise on Lovenox .

## 2023-03-11 NOTE — Telephone Encounter (Signed)
 Forwarding to Dr. Karma Greaser.

## 2023-03-11 NOTE — Telephone Encounter (Signed)
Patient notified per Dr. Edward Jolly. Patient aware our office will f/u if any additional recommendations from Dr. Karma Greaser when she returns to the office on 03/14/23. Patient verbalizes understanding and is agreeable.

## 2023-03-11 NOTE — Telephone Encounter (Signed)
 Lovenox  is administered daily after surgery for 10 - 14 days in order to reduce risk of deep venous thrombosis.   She can bring the prescription to the hospital with her on the day of her procedure in order to receive instructions from the nursing staff on how to administer the medication to herself (or have a family member administer it).   Let's leave this phone message in Dr. Mcarthur inbox so she can advise if the patient will receive a dose of Lovenox  2 hours before start of surgery.   Usually the preop dosage is given at the hospital in the preop holding area.

## 2023-03-13 ENCOUNTER — Encounter: Payer: Self-pay | Admitting: Obstetrics and Gynecology

## 2023-03-14 NOTE — Telephone Encounter (Signed)
 Patient viewed MyChart message from Dr. Karma Greaser dated 03/13/23.   Encounter closed.

## 2023-03-17 ENCOUNTER — Encounter (HOSPITAL_COMMUNITY)
Admission: RE | Admit: 2023-03-17 | Discharge: 2023-03-17 | Disposition: A | Discharge status: Home / Self Care | Payer: BC Managed Care – PPO | Source: Ambulatory Visit | Attending: Obstetrics and Gynecology | Admitting: Obstetrics and Gynecology

## 2023-03-17 DIAGNOSIS — Z01818 Encounter for other preprocedural examination: Secondary | ICD-10-CM

## 2023-03-17 DIAGNOSIS — R9431 Abnormal electrocardiogram [ECG] [EKG]: Secondary | ICD-10-CM | POA: Insufficient documentation

## 2023-03-17 LAB — BASIC METABOLIC PANEL
Anion gap: 9 (ref 5–15)
BUN: 14 mg/dL (ref 6–20)
CO2: 23 mmol/L (ref 22–32)
Calcium: 9 mg/dL (ref 8.9–10.3)
Chloride: 107 mmol/L (ref 98–111)
Creatinine, Ser: 0.94 mg/dL (ref 0.44–1.00)
GFR, Estimated: >60 mL/min (ref >60)
Glucose, Bld: 87 mg/dL (ref 70–99)
Potassium: 4.3 mmol/L (ref 3.5–5.1)
Sodium: 139 mmol/L (ref 135–145)

## 2023-03-17 LAB — CBC
HCT: 41.6 % (ref 36.0–46.0)
Hemoglobin: 13.5 g/dL (ref 12.0–15.0)
MCH: 32.5 pg (ref 26.0–34.0)
MCHC: 32.5 g/dL (ref 30.0–36.0)
MCV: 100.2 fL — ABNORMAL HIGH (ref 80.0–100.0)
Platelets: 266 10*3/uL (ref 150–400)
RBC: 4.15 MIL/uL (ref 3.87–5.11)
RDW: 14.7 % (ref 11.5–15.5)
WBC: 5 10*3/uL (ref 4.0–10.5)
nRBC: 0 % (ref 0.0–0.2)

## 2023-03-21 ENCOUNTER — Ambulatory Visit (HOSPITAL_BASED_OUTPATIENT_CLINIC_OR_DEPARTMENT_OTHER): Payer: BC Managed Care – PPO | Admitting: Anesthesiology

## 2023-03-21 ENCOUNTER — Encounter (HOSPITAL_BASED_OUTPATIENT_CLINIC_OR_DEPARTMENT_OTHER): Payer: Self-pay | Admitting: Obstetrics and Gynecology

## 2023-03-21 ENCOUNTER — Encounter (HOSPITAL_BASED_OUTPATIENT_CLINIC_OR_DEPARTMENT_OTHER)
Admission: RE | Disposition: A | Discharge status: Home / Self Care | Payer: Self-pay | Source: Home / Self Care | Attending: Obstetrics and Gynecology

## 2023-03-21 ENCOUNTER — Other Ambulatory Visit: Payer: Self-pay

## 2023-03-21 ENCOUNTER — Ambulatory Visit (HOSPITAL_BASED_OUTPATIENT_CLINIC_OR_DEPARTMENT_OTHER)
Admission: RE | Admit: 2023-03-21 | Discharge: 2023-03-21 | Disposition: A | Discharge status: Home / Self Care | Payer: BC Managed Care – PPO | Attending: Obstetrics and Gynecology | Admitting: Obstetrics and Gynecology

## 2023-03-21 DIAGNOSIS — N938 Other specified abnormal uterine and vaginal bleeding: Secondary | ICD-10-CM

## 2023-03-21 DIAGNOSIS — D259 Leiomyoma of uterus, unspecified: Secondary | ICD-10-CM | POA: Diagnosis not present

## 2023-03-21 DIAGNOSIS — Z86718 Personal history of other venous thrombosis and embolism: Secondary | ICD-10-CM | POA: Insufficient documentation

## 2023-03-21 DIAGNOSIS — E039 Hypothyroidism, unspecified: Secondary | ICD-10-CM | POA: Diagnosis not present

## 2023-03-21 DIAGNOSIS — G4733 Obstructive sleep apnea (adult) (pediatric): Secondary | ICD-10-CM | POA: Diagnosis not present

## 2023-03-21 DIAGNOSIS — I10 Essential (primary) hypertension: Secondary | ICD-10-CM | POA: Diagnosis not present

## 2023-03-21 DIAGNOSIS — N921 Excessive and frequent menstruation with irregular cycle: Secondary | ICD-10-CM | POA: Diagnosis not present

## 2023-03-21 DIAGNOSIS — N946 Dysmenorrhea, unspecified: Secondary | ICD-10-CM | POA: Diagnosis not present

## 2023-03-21 DIAGNOSIS — D649 Anemia, unspecified: Secondary | ICD-10-CM | POA: Insufficient documentation

## 2023-03-21 DIAGNOSIS — Z01818 Encounter for other preprocedural examination: Secondary | ICD-10-CM

## 2023-03-21 DIAGNOSIS — N8003 Adenomyosis of the uterus: Secondary | ICD-10-CM | POA: Diagnosis not present

## 2023-03-21 DIAGNOSIS — N920 Excessive and frequent menstruation with regular cycle: Secondary | ICD-10-CM | POA: Insufficient documentation

## 2023-03-21 DIAGNOSIS — Z302 Encounter for sterilization: Secondary | ICD-10-CM | POA: Diagnosis not present

## 2023-03-21 HISTORY — DX: Iron deficiency anemia, unspecified: D50.9

## 2023-03-21 HISTORY — DX: Hypothyroidism, unspecified: E03.9

## 2023-03-21 HISTORY — DX: Insomnia, unspecified: G47.00

## 2023-03-21 HISTORY — PX: ROBOTIC ASSISTED LAPAROSCOPIC HYSTERECTOMY AND SALPINGECTOMY: SHX6379

## 2023-03-21 HISTORY — DX: Adenomyosis of the uterus: N80.03

## 2023-03-21 HISTORY — DX: Presence of spectacles and contact lenses: Z97.3

## 2023-03-21 HISTORY — DX: Leiomyoma of uterus, unspecified: D25.9

## 2023-03-21 HISTORY — PX: CYSTOSCOPY: SHX5120

## 2023-03-21 HISTORY — DX: Dysmenorrhea, unspecified: N94.6

## 2023-03-21 HISTORY — DX: Excessive and frequent menstruation with regular cycle: N92.0

## 2023-03-21 HISTORY — DX: Supraventricular tachycardia, unspecified: I47.10

## 2023-03-21 LAB — TYPE AND SCREEN
ABO/RH(D): A POS
Antibody Screen: NEGATIVE

## 2023-03-21 LAB — POCT PREGNANCY, URINE: Preg Test, Ur: NEGATIVE

## 2023-03-21 LAB — ABO/RH: ABO/RH(D): A POS

## 2023-03-21 SURGERY — XI ROBOTIC ASSISTED LAPAROSCOPIC HYSTERECTOMY AND SALPINGECTOMY
Anesthesia: General | Site: Pelvis

## 2023-03-21 MED ORDER — ACETAMINOPHEN 500 MG PO TABS
ORAL_TABLET | ORAL | Status: AC
  Filled 2023-03-21: qty 2

## 2023-03-21 MED ORDER — DEXAMETHASONE SODIUM PHOSPHATE 10 MG/ML IJ SOLN
INTRAMUSCULAR | Status: AC
  Filled 2023-03-21: qty 1

## 2023-03-21 MED ORDER — GABAPENTIN 300 MG PO CAPS
300.0000 mg | ORAL_CAPSULE | ORAL | Status: AC
  Administered 2023-03-21: 300 mg via ORAL
  Filled 2023-03-21: qty 1

## 2023-03-21 MED ORDER — DEXMEDETOMIDINE HCL IN NACL 80 MCG/20ML IV SOLN
INTRAVENOUS | Status: DC | PRN
  Administered 2023-03-21: 12 ug via INTRAVENOUS

## 2023-03-21 MED ORDER — GABAPENTIN 100 MG PO CAPS: 100.0000 mg | ORAL_CAPSULE | Freq: Once | ORAL | Status: DC

## 2023-03-21 MED ORDER — ONDANSETRON HCL 4 MG/2ML IJ SOLN
INTRAMUSCULAR | Status: DC | PRN
  Administered 2023-03-21: 4 mg via INTRAVENOUS

## 2023-03-21 MED ORDER — HEPARIN SODIUM (PORCINE) 1000 UNIT/ML IJ SOLN
5000.0000 [IU] | Freq: Once | INTRAMUSCULAR | Status: DC
Start: 2023-03-21 — End: 2023-03-21
  Filled 2023-03-21: qty 5

## 2023-03-21 MED ORDER — DEXAMETHASONE SODIUM PHOSPHATE 10 MG/ML IJ SOLN
INTRAMUSCULAR | Status: DC | PRN
  Administered 2023-03-21: 10 mg via INTRAVENOUS

## 2023-03-21 MED ORDER — LIDOCAINE 2% (20 MG/ML) 5 ML SYRINGE
INTRAMUSCULAR | Status: DC | PRN
  Administered 2023-03-21: 40 mg via INTRAVENOUS

## 2023-03-21 MED ORDER — SODIUM CHLORIDE 0.9 % IV SOLN
Freq: Once | INTRAVENOUS | Status: DC
  Filled 2023-03-21: qty 10

## 2023-03-21 MED ORDER — HEPARIN SODIUM (PORCINE) 5000 UNIT/ML IJ SOLN
5000.0000 [IU] | Freq: Once | INTRAMUSCULAR | Status: AC
  Administered 2023-03-21: 5000 [IU] via SUBCUTANEOUS

## 2023-03-21 MED ORDER — CEFOXITIN SODIUM 2 G IV SOLR
INTRAVENOUS | Status: AC
  Filled 2023-03-21: qty 2

## 2023-03-21 MED ORDER — SCOPOLAMINE 1 MG/3DAYS TD PT72
MEDICATED_PATCH | TRANSDERMAL | Status: AC
  Filled 2023-03-21: qty 1

## 2023-03-21 MED ORDER — BUPIVACAINE HCL (PF) 0.5 % IJ SOLN
INTRAMUSCULAR | Status: DC | PRN
  Administered 2023-03-21: 20 mL

## 2023-03-21 MED ORDER — HYDROMORPHONE HCL 1 MG/ML IJ SOLN: 0.2000 mg | INTRAMUSCULAR | Status: DC | PRN

## 2023-03-21 MED ORDER — ACETAMINOPHEN 160 MG/5ML PO SOLN
325.0000 mg | ORAL | Status: DC | PRN
Start: 2023-03-21 — End: 2023-03-21

## 2023-03-21 MED ORDER — SODIUM CHLORIDE 0.9 % IV SOLN
INTRAVENOUS | Status: AC
  Filled 2023-03-21: qty 2

## 2023-03-21 MED ORDER — MIDAZOLAM HCL 2 MG/2ML IJ SOLN
INTRAMUSCULAR | Status: AC
  Filled 2023-03-21: qty 2

## 2023-03-21 MED ORDER — ACETAMINOPHEN 500 MG PO TABS
1000.0000 mg | ORAL_TABLET | ORAL | Status: AC
  Administered 2023-03-21: 1000 mg via ORAL

## 2023-03-21 MED ORDER — HEPARIN SODIUM (PORCINE) 5000 UNIT/ML IJ SOLN
INTRAMUSCULAR | Status: AC
  Filled 2023-03-21: qty 1

## 2023-03-21 MED ORDER — PROPOFOL 10 MG/ML IV BOLUS
INTRAVENOUS | Status: DC | PRN
  Administered 2023-03-21: 150 mg via INTRAVENOUS

## 2023-03-21 MED ORDER — FENTANYL CITRATE (PF) 100 MCG/2ML IJ SOLN
INTRAMUSCULAR | Status: DC | PRN
  Administered 2023-03-21: 100 ug via INTRAVENOUS
  Administered 2023-03-21: 50 ug via INTRAVENOUS

## 2023-03-21 MED ORDER — METHYLENE BLUE (ANTIDOTE) 1 % IV SOLN
INTRAVENOUS | Status: AC
  Filled 2023-03-21: qty 10

## 2023-03-21 MED ORDER — FENTANYL CITRATE (PF) 100 MCG/2ML IJ SOLN
INTRAMUSCULAR | Status: AC
  Filled 2023-03-21: qty 2

## 2023-03-21 MED ORDER — ONDANSETRON HCL 4 MG/2ML IJ SOLN
INTRAMUSCULAR | Status: AC
  Filled 2023-03-21: qty 2

## 2023-03-21 MED ORDER — LIDOCAINE HCL (PF) 2 % IJ SOLN
INTRAMUSCULAR | Status: AC
  Filled 2023-03-21: qty 5

## 2023-03-21 MED ORDER — ROCURONIUM BROMIDE 10 MG/ML (PF) SYRINGE
PREFILLED_SYRINGE | INTRAVENOUS | Status: DC | PRN
  Administered 2023-03-21: 50 mg via INTRAVENOUS

## 2023-03-21 MED ORDER — LACTATED RINGERS IV SOLN: INTRAVENOUS | Status: DC

## 2023-03-21 MED ORDER — ONDANSETRON HCL 4 MG/2ML IJ SOLN: 4.0000 mg | Freq: Four times a day (QID) | INTRAMUSCULAR | Status: DC | PRN

## 2023-03-21 MED ORDER — SUGAMMADEX SODIUM 200 MG/2ML IV SOLN
INTRAVENOUS | Status: DC | PRN
  Administered 2023-03-21: 200 mg via INTRAVENOUS

## 2023-03-21 MED ORDER — SODIUM CHLORIDE 0.9 % IV SOLN
INTRAVENOUS | Status: DC | PRN
  Administered 2023-03-21: 1000 mL

## 2023-03-21 MED ORDER — KETOROLAC TROMETHAMINE 30 MG/ML IJ SOLN
INTRAMUSCULAR | Status: AC
  Filled 2023-03-21: qty 1

## 2023-03-21 MED ORDER — FENTANYL CITRATE (PF) 100 MCG/2ML IJ SOLN
25.0000 ug | INTRAMUSCULAR | Status: DC | PRN
  Administered 2023-03-21 (×2): 25 ug via INTRAVENOUS

## 2023-03-21 MED ORDER — POVIDONE-IODINE 10 % EX SWAB: 2.0000 | Freq: Once | CUTANEOUS | Status: DC

## 2023-03-21 MED ORDER — ACETAMINOPHEN 10 MG/ML IV SOLN
1000.0000 mg | Freq: Once | INTRAVENOUS | Status: DC | PRN
Start: 2023-03-21 — End: 2023-03-21

## 2023-03-21 MED ORDER — FENTANYL CITRATE (PF) 250 MCG/5ML IJ SOLN
INTRAMUSCULAR | Status: AC
  Filled 2023-03-21: qty 5

## 2023-03-21 MED ORDER — STERILE WATER FOR IRRIGATION IR SOLN
Status: DC | PRN
  Administered 2023-03-21: 500 mL

## 2023-03-21 MED ORDER — DROPERIDOL 2.5 MG/ML IJ SOLN: 0.6250 mg | Freq: Once | INTRAMUSCULAR | Status: DC | PRN

## 2023-03-21 MED ORDER — ACETAMINOPHEN 325 MG PO TABS
325.0000 mg | ORAL_TABLET | ORAL | Status: DC | PRN
Start: 2023-03-21 — End: 2023-03-21

## 2023-03-21 MED ORDER — OXYCODONE HCL 5 MG/5ML PO SOLN: 5.0000 mg | Freq: Once | ORAL | Status: AC | PRN

## 2023-03-21 MED ORDER — MIDAZOLAM HCL 5 MG/5ML IJ SOLN
INTRAMUSCULAR | Status: DC | PRN
  Administered 2023-03-21: 2 mg via INTRAVENOUS

## 2023-03-21 MED ORDER — SCOPOLAMINE 1 MG/3DAYS TD PT72
1.0000 | MEDICATED_PATCH | TRANSDERMAL | Status: DC
  Administered 2023-03-21: 1.5 mg via TRANSDERMAL

## 2023-03-21 MED ORDER — CEFAZOLIN SODIUM 1 G IJ SOLR
Freq: Once | INTRAMUSCULAR | Status: DC
  Filled 2023-03-21: qty 10

## 2023-03-21 MED ORDER — OXYCODONE HCL 5 MG PO TABS
5.0000 mg | ORAL_TABLET | Freq: Once | ORAL | Status: AC | PRN
  Administered 2023-03-21: 5 mg via ORAL

## 2023-03-21 MED ORDER — PANTOPRAZOLE SODIUM 40 MG IV SOLR: 40.0000 mg | Freq: Every day | INTRAVENOUS | Status: DC

## 2023-03-21 MED ORDER — OXYCODONE HCL 5 MG PO TABS
ORAL_TABLET | ORAL | Status: AC
  Filled 2023-03-21: qty 1

## 2023-03-21 MED ORDER — ACETAMINOPHEN 325 MG PO TABS
650.0000 mg | ORAL_TABLET | ORAL | Status: DC | PRN
Start: 2023-03-21 — End: 2023-03-21

## 2023-03-21 MED ORDER — ROCURONIUM BROMIDE 10 MG/ML (PF) SYRINGE
PREFILLED_SYRINGE | INTRAVENOUS | Status: AC
  Filled 2023-03-21: qty 10

## 2023-03-21 MED ORDER — OXYCODONE HCL 5 MG PO TABS
5.0000 mg | ORAL_TABLET | ORAL | Status: DC | PRN
Start: 2023-03-21 — End: 2023-03-21

## 2023-03-21 MED ORDER — SODIUM CHLORIDE 0.9 % IV SOLN
2.0000 g | INTRAVENOUS | Status: AC
  Administered 2023-03-21: 2 g via INTRAVENOUS

## 2023-03-21 MED ORDER — ONDANSETRON HCL 4 MG PO TABS: 4.0000 mg | ORAL_TABLET | Freq: Four times a day (QID) | ORAL | Status: DC | PRN

## 2023-03-21 SURGICAL SUPPLY — 61 items
APPLICATOR ARISTA FLEXITIP XL (MISCELLANEOUS) IMPLANT
BARRIER ADHS 3X4 INTERCEED (GAUZE/BANDAGES/DRESSINGS) IMPLANT
CATH FOLEY 3WAY 5CC 16FR (CATHETERS) ×2 IMPLANT
COVER BACK TABLE 60X90IN (DRAPES) ×2 IMPLANT
COVER TIP SHEARS 8 DVNC (MISCELLANEOUS) ×2 IMPLANT
DEFOGGER SCOPE WARMER CLEARIFY (MISCELLANEOUS) ×2 IMPLANT
DERMABOND ADVANCED .7 DNX12 (GAUZE/BANDAGES/DRESSINGS) ×2 IMPLANT
DRAPE ARM DVNC X/XI (DISPOSABLE) ×8 IMPLANT
DRAPE COLUMN DVNC XI (DISPOSABLE) ×2 IMPLANT
DRAPE UTILITY XL STRL (DRAPES) ×2 IMPLANT
DRIVER NDL MEGA SUTCUT DVNCXI (INSTRUMENTS) IMPLANT
DRIVER NDLE MEGA SUTCUT DVNCXI (INSTRUMENTS) ×2 IMPLANT
DURAPREP 26ML APPLICATOR (WOUND CARE) ×2 IMPLANT
ELECT REM PT RETURN 9FT ADLT (ELECTROSURGICAL) ×2 IMPLANT
ELECTRODE REM PT RTRN 9FT ADLT (ELECTROSURGICAL) ×2 IMPLANT
FORCEPS PROGRASP DVNC XI (FORCEP) IMPLANT
GAUZE 4X4 16PLY ~~LOC~~+RFID DBL (SPONGE) IMPLANT
GLOVE BIO SURGEON STRL SZ7 (GLOVE) IMPLANT
GLOVE BIOGEL PI IND STRL 6.5 (GLOVE) IMPLANT
GLOVE NEODERM STER SZ 7 (GLOVE) ×6 IMPLANT
GLOVE SURG SS PI 7.0 STRL IVOR (GLOVE) IMPLANT
GYRUS RUMI II 2.5CM BLUE (DISPOSABLE) IMPLANT
GYRUS RUMI II 3.5CM BLUE (DISPOSABLE) IMPLANT
GYRUS RUMI II 4.0CM BLUE (DISPOSABLE) IMPLANT
HEMOSTAT ARISTA ABSORB 3G PWDR (HEMOSTASIS) IMPLANT
HIBICLENS CHG 4% 4OZ BTL (MISCELLANEOUS) ×4 IMPLANT
HOLDER FOLEY CATH W/STRAP (MISCELLANEOUS) IMPLANT
IRRIG SUCT STRYKERFLOW 2 WTIP (MISCELLANEOUS) ×2 IMPLANT
IRRIGATION SUCT STRKRFLW 2 WTP (MISCELLANEOUS) ×2 IMPLANT
KIT PINK PAD W/HEAD ARE REST (MISCELLANEOUS) ×2 IMPLANT
KIT PINK PAD W/HEAD ARM REST (MISCELLANEOUS) ×2 IMPLANT
KIT TURNOVER CYSTO (KITS) ×2 IMPLANT
LEGGING LITHOTOMY PAIR STRL (DRAPES) ×2 IMPLANT
MANIFOLD NEPTUNE II (INSTRUMENTS) ×2 IMPLANT
OBTURATOR OPTICAL STND 8 DVNC (TROCAR) ×2 IMPLANT
OBTURATOR OPTICALSTD 8 DVNC (TROCAR) ×2 IMPLANT
PACK ROBOT WH (CUSTOM PROCEDURE TRAY) ×2 IMPLANT
PACK ROBOTIC GOWN (GOWN DISPOSABLE) ×2 IMPLANT
PAD OB MATERNITY 4.3X12.25 (PERSONAL CARE ITEMS) ×2 IMPLANT
RUMI II 3.0CM BLUE KOH-EFFICIE (DISPOSABLE) IMPLANT
RUMI II GYRUS 2.5CM BLUE (DISPOSABLE) IMPLANT
RUMI II GYRUS 3.5CM BLUE (DISPOSABLE) IMPLANT
RUMI II GYRUS 4.0CM BLUE (DISPOSABLE) IMPLANT
SCISSORS MNPLR CVD DVNC XI (INSTRUMENTS) IMPLANT
SCOPETTES 8 STERILE (MISCELLANEOUS) IMPLANT
SEAL UNIV 5-12 XI (MISCELLANEOUS) ×6 IMPLANT
SEALER VESSEL EXT DVNC XI (MISCELLANEOUS) IMPLANT
SET IRRIG Y TYPE TUR BLADDER L (SET/KITS/TRAYS/PACK) ×2 IMPLANT
SET TUBE SMOKE EVAC HIGH FLOW (TUBING) ×2 IMPLANT
SOL PREP POV-IOD 4OZ 10% (MISCELLANEOUS) ×2 IMPLANT
SPIKE FLUID TRANSFER (MISCELLANEOUS) ×2 IMPLANT
SUT MNCRL AB 4-0 PS2 18 (SUTURE) ×2 IMPLANT
SUT VLOC 180 0 9IN GS21 (SUTURE) ×2 IMPLANT
TIP RUMI ORANGE 6.7MMX12CM (TIP) IMPLANT
TIP UTERINE 5.1X6CM LAV DISP (MISCELLANEOUS) IMPLANT
TIP UTERINE 6.7X10CM GRN DISP (MISCELLANEOUS) IMPLANT
TIP UTERINE 6.7X6CM WHT DISP (MISCELLANEOUS) IMPLANT
TIP UTERINE 6.7X8CM BLUE DISP (MISCELLANEOUS) IMPLANT
TOWEL OR 17X24 6PK STRL BLUE (TOWEL DISPOSABLE) ×2 IMPLANT
UNDERPAD 30X36 HEAVY ABSORB (UNDERPADS AND DIAPERS) ×2 IMPLANT
WATER STERILE IRR 500ML POUR (IV SOLUTION) ×2 IMPLANT

## 2023-03-21 NOTE — Transfer of Care (Signed)
 Immediate Anesthesia Transfer of Care Note  Patient: Katie Coffey  Procedure(s) Performed: XI ROBOTIC ASSISTED LAPAROSCOPIC HYSTERECTOMY AND SALPINGECTOMY (Bilateral: Pelvis) CYSTOSCOPY (Bladder)  Patient Location: PACU  Anesthesia Type:General  Level of Consciousness: awake, alert , and patient cooperative  Airway & Oxygen Therapy: Patient Spontanous Breathing and Patient connected to nasal cannula oxygen  Post-op Assessment: Report given to RN and Post -op Vital signs reviewed and stable  Post vital signs: Reviewed and stable  Last Vitals:  Vitals Value Taken Time  BP    Temp    Pulse 74 03/21/23 1116  Resp    SpO2 100 % 03/21/23 1116  Vitals shown include unfiled device data.  Last Pain:  Vitals:   03/21/23 0734  TempSrc: Oral  PainSc: 0-No pain      Patients Stated Pain Goal: 5 (03/21/23 0734)  Complications: No notable events documented.

## 2023-03-21 NOTE — Anesthesia Procedure Notes (Signed)
 Procedure Name: Intubation Date/Time: 03/21/2023 9:59 AM  Performed by: Donnell Berwyn SQUIBB, CRNAPre-anesthesia Checklist: Patient identified, Emergency Drugs available, Suction available, Patient being monitored and Timeout performed Patient Re-evaluated:Patient Re-evaluated prior to induction Oxygen Delivery Method: Circle system utilized Preoxygenation: Pre-oxygenation with 100% oxygen Induction Type: IV induction Ventilation: Mask ventilation without difficulty Laryngoscope Size: Mac and 3 Grade View: Grade I Tube type: Oral Tube size: 7.0 mm Number of attempts: 1 Airway Equipment and Method: Stylet Placement Confirmation: ETT inserted through vocal cords under direct vision, positive ETCO2 and breath sounds checked- equal and bilateral Secured at: 20 cm Tube secured with: Tape Dental Injury: Teeth and Oropharynx as per pre-operative assessment

## 2023-03-21 NOTE — Anesthesia Preprocedure Evaluation (Addendum)
 Anesthesia Evaluation  Patient identified by MRN, date of birth, ID band Patient awake    Reviewed: Allergy & Precautions, NPO status , Patient's Chart, lab work & pertinent test results  Airway Mallampati: I  TM Distance: >3 FB Neck ROM: Full    Dental  (+) Teeth Intact, Dental Advisory Given   Pulmonary sleep apnea    breath sounds clear to auscultation       Cardiovascular hypertension, Pt. on home beta blockers  Rhythm:Regular Rate:Normal     Neuro/Psych  Headaches  negative psych ROS   GI/Hepatic negative GI ROS, Neg liver ROS,,,  Endo/Other  Hypothyroidism    Renal/GU Renal disease     Musculoskeletal negative musculoskeletal ROS (+)    Abdominal   Peds  Hematology  (+) Blood dyscrasia, anemia   Anesthesia Other Findings   Reproductive/Obstetrics                             Anesthesia Physical Anesthesia Plan  ASA: 2  Anesthesia Plan: General   Post-op Pain Management: Tylenol  PO (pre-op)*, Toradol  IV (intra-op)* and Gabapentin  PO (pre-op)*   Induction: Intravenous  PONV Risk Score and Plan: 4 or greater and Ondansetron , Dexamethasone , Midazolam  and Scopolamine  patch - Pre-op  Airway Management Planned: Oral ETT  Additional Equipment: None  Intra-op Plan:   Post-operative Plan: Extubation in OR  Informed Consent: I have reviewed the patients History and Physical, chart, labs and discussed the procedure including the risks, benefits and alternatives for the proposed anesthesia with the patient or authorized representative who has indicated his/her understanding and acceptance.     Dental advisory given  Plan Discussed with: CRNA  Anesthesia Plan Comments:        Anesthesia Quick Evaluation

## 2023-03-21 NOTE — Anesthesia Postprocedure Evaluation (Signed)
 Anesthesia Post Note  Patient: Katie Coffey  Procedure(s) Performed: XI ROBOTIC ASSISTED LAPAROSCOPIC HYSTERECTOMY AND SALPINGECTOMY (Bilateral: Pelvis) CYSTOSCOPY (Bladder)     Patient location during evaluation: PACU Anesthesia Type: General Level of consciousness: awake and alert Pain management: pain level controlled Vital Signs Assessment: post-procedure vital signs reviewed and stable Respiratory status: spontaneous breathing, nonlabored ventilation, respiratory function stable and patient connected to nasal cannula oxygen Cardiovascular status: blood pressure returned to baseline and stable Postop Assessment: no apparent nausea or vomiting Anesthetic complications: no  No notable events documented.  Last Vitals:  Vitals:   03/21/23 1145 03/21/23 1200  BP: 114/61 (!) 122/99  Pulse: 67 63  Resp: 18 19  Temp:    SpO2: 100% 100%    Last Pain:  Vitals:   03/21/23 1215  TempSrc:   PainSc: 5                  Franky JONETTA Bald

## 2023-03-21 NOTE — Op Note (Signed)
 03/21/2023  [id number] Katie Coffey        OPERATIVE REPORT   Preop Diagnosis: menorrhagia, dysmenorrhea, anemia, fibroids, adenomyosis, h/o ltcs x3 with tubal ligation  Procedure: robotic hysterectomy, bilateral salpingectomy, cystoscopy   Surgeon: Dr. Almarie Sauer Larence Thone Assistant: Judyann Rattler, RN   Fluids: please see anesthesia report   Complications: None Anesthesia: General     Findings:  boggy contour 8cm fibroid uterus, normal ovaries and tubes, evidence of prior tubal ligation Cystoscopy at the end of the case with normal bladder and patent ureters bilaterally.   Estimated blood loss: Minimal <30cc   Specimens: Uterus, cervix and bilateral tubes   Disposition of specimen: Pathology           Patient is taken to the operating room. She is placed in the supine position. She is a running IV in place. Informed consent was present on the chart. SCDs on her lower extremities and functioning properly. Patient was positioned while she was awake.  Her legs were placed in the low lithotomy position in [place] stirrups. Her arms were tucked by the side.  General endotracheal anesthesia was administered by the anesthesia staff without difficulty.       Dura prep was then used to prep the abdomen and Hibiclens  was used to prep the inner thighs, perineum and vagina. Once 3 minutes had past the patient was draped in a normal standard fashion. A proper time out was performed and everyone agreed.  The legs were lifted to the high lithotomy position. A bivalve speculum was inserted into the vagina and the anterior lip of the cervix was grasped with single-tooth tenaculum.  The uterus sounded to 10 cm. Pratt dilators were used to dilate the cervix.  The RUMI uterine manipulator was obtained inserted into the endometrial cavity and the bulb of the disposable tip was inflated with 8 cc of normal saline. There was a good fit of the KOH ring around the cervix. The tenaculum and bivavle  speculum was removed. There is also good manipulation of the uterus.  A Foley catheter was placed to straight drain.  Clear urine was noted. Legs were lowered to the low lithotomy position and attention was turned the abdomen.   Superior to the umbilicus, marcaine  0.25% used to anesthetize the skin.  Using #11 blade, 8mm skin incision was made.  The 8mm robotic trocar and sleeve was inserted under direct visualization.  CO2 gas was  started and patient was placed in trendelenburg position.  Two additional 8mm ports were placed under direct visualization in the left and right lower quadrant.     Ureters were identifies.  Attention was turned to the left side. The left tube was elevated and the mesosalpinx was desiccated with the vessel sealer.  The left uterine ovarian pedicle was serially clamped cauterized and incised. Left round ligament was serially clamped cauterized and incised. The anterior and posterior peritoneum of the inferior leaf of the broad ligament were opened. The beginning of the bladder flap was created.  The bladder was taken down below the level of the KOH ring. The left uterine artery skeletonized and then just superior to the KOH ring this vessel was serially clamped, cauterized, and incised.   Attention was turned the right side.  The uterus was placed on stretch to the opposite side.    The mesosalpinx was incised freeing the tube. Then the right uterine ovarian pedicle was serially clamped cauterized and incised. Next the right round ligament was serially clamped cauterized  and incised. The anterior posterior peritoneum of the inferiorly for the broad ligament were opened. The anterior peritoneum was carried across to the dissection on the left side. The remainder of the bladder flap was created using sharp dissection. The bladder was well below the level of the KOH ring. The right uterine artery skeletonized. Then the right uterine artery, above the level of the KOH ring, was serially  clamped cauterized and incised. The uterus was devascularized at this point.   The colpotomy was performed.  This was carried around a circumferential fashion until the vaginal mucosa was completely incised in the specimen was freed.  The specimen was then delivered to the vagina intact.  A vaginal occlusive device was used to maintain the pneumoperitoneum   Instruments were changed with a needle driver and prograsp.  Using a 9 inch  zero V-lock suture, the cuff was closed by incorporating the anterior and posterior vaginal mucosa in each stitch. This was carried across all the way to the left corner and a running fashion. Two stitches were brought back towards the midline and the suture was cut flush with the vagina. The needle was brought out the pelvis. The pelvis was irrigated. All pedicles were inspected. No bleeding was noted.   Co2 pressures were lowered to 8mm Hg.  Again, no bleeding was noted.  Ureters were noted deep in the pelvis to be peristalsing.  At this point the procedure was completed.  The remaining instruments were removed.  The ports were removed under direct visualization of the laparoscope and the pneumoperitoneum was relieved.   The skin was then closed with subcuticular stitches of 3-0 Vicryl. The skin was cleansed Dermabond was applied. Attention was then turned the vagina and the cuff was inspected. No bleeding was noted.  The Foley catheter was removed.  Cystoscopy was performed.  No sutures or bladder injuries were noted.  Ureters were noted with normal urine jets from each one was seen.  Foley was left out after the cystoscopic fluid was drained and cystoscope removed.  Sponge, lap, needle, instrument counts were correct x2. Patient tolerated the procedure very well. She was awakened from anesthesia, extubated and taken to recovery in stable condition.      Dr. Glennon

## 2023-03-21 NOTE — Interval H&P Note (Signed)
 History and Physical Interval Note:  03/21/2023 9:43 AM  Katie Coffey  has presented today for surgery, with the diagnosis of Menorrhagia with irregular cycle, dysfunctional uterine bleeding, Dysmenorrhea, Adenomyosis, Cesarean delivery delivered.  The various methods of treatment have been discussed with the patient and family. After consideration of risks, benefits and other options for treatment, the patient has consented to  Procedure(s): XI ROBOTIC ASSISTED LAPAROSCOPIC HYSTERECTOMY AND SALPINGECTOMY (Bilateral) CYSTOSCOPY (N/A) as a surgical intervention.  The patient's history has been reviewed, patient examined, no change in status, stable for surgery.  I have reviewed the patient's chart and labs.  Questions were answered to the patient's satisfaction.     [name]

## 2023-03-21 NOTE — Discharge Instructions (Signed)
   No acetaminophen/Tylenol until after 2 pm today if needed.   Post Anesthesia Home Care Instructions  Activity: Get plenty of rest for the remainder of the day. A responsible individual must stay with you for 24 hours following the procedure.  For the next 24 hours, DO NOT: -Drive a car -Advertising copywriter -Drink alcoholic beverages -Take any medication unless instructed by your physician -Make any legal decisions or sign important papers.  Meals: Start with liquid foods such as gelatin or soup. Progress to regular foods as tolerated. Avoid greasy, spicy, heavy foods. If nausea and/or vomiting occur, drink only clear liquids until the nausea and/or vomiting subsides. Call your physician if vomiting continues.  Special Instructions/Symptoms: Your throat may feel dry or sore from the anesthesia or the breathing tube placed in your throat during surgery. If this causes discomfort, gargle with warm salt water. The discomfort should disappear within 24 hours.  If you had a scopolamine patch placed behind your ear for the management of post- operative nausea and/or vomiting:  1. The medication in the patch is effective for 72 hours, after which it should be removed.  Wrap patch in a tissue and discard in the trash. Wash hands thoroughly with soap and water. 2. You may remove the patch earlier than 72 hours if you experience unpleasant side effects which may include dry mouth, dizziness or visual disturbances. 3. Avoid touching the patch. Wash your hands with soap and water after contact with the patch.

## 2023-03-22 ENCOUNTER — Encounter (HOSPITAL_BASED_OUTPATIENT_CLINIC_OR_DEPARTMENT_OTHER): Payer: Self-pay | Admitting: Obstetrics and Gynecology

## 2023-03-24 ENCOUNTER — Encounter: Payer: Self-pay | Admitting: Obstetrics and Gynecology

## 2023-03-24 LAB — SURGICAL PATHOLOGY

## 2023-03-24 NOTE — Telephone Encounter (Signed)
 Call placed to patient. Denies redness, swelling, bruising, warmth at injection sites. Questions answered regarding SQ injections for lovenox , education provided. Patient verbalizes understanding.   Routing to provider for final review. Patient is agreeable to disposition. Will close encounter.

## 2023-03-31 ENCOUNTER — Encounter: Payer: Self-pay | Admitting: Obstetrics and Gynecology

## 2023-03-31 ENCOUNTER — Ambulatory Visit (INDEPENDENT_AMBULATORY_CARE_PROVIDER_SITE_OTHER): Payer: BC Managed Care – PPO | Admitting: Obstetrics and Gynecology

## 2023-03-31 ENCOUNTER — Telehealth: Payer: Self-pay | Admitting: *Deleted

## 2023-03-31 VITALS — BP 122/82 | HR 62 | Temp 98.3°F

## 2023-03-31 DIAGNOSIS — Z9889 Other specified postprocedural states: Secondary | ICD-10-CM

## 2023-03-31 MED ORDER — PREDNISONE 10 MG (21) PO TBPK
ORAL_TABLET | ORAL | 0 refills | Status: DC
Start: 2023-03-31 — End: 2023-04-09

## 2023-03-31 MED ORDER — HYDROXYZINE HCL 10 MG PO TABS: 10.0000 mg | ORAL_TABLET | Freq: Three times a day (TID) | ORAL | 0 refills | Status: DC | PRN

## 2023-03-31 NOTE — Telephone Encounter (Signed)
Call returned to patient. Patient is s/p [facility] 03/21/23. Patient reports itching around incision sites started on Friday, has now developed rash on stomach and legs, feels like "fire". Has tried OTC benadryl and pepcid, no change or relief. Has not started any new medications. Has not been able to sleep. Incisions are closed. Denies fever/chills or cold/fluc symptoms.   OV scheduled for today at 1015 with Dr. Karma Greaser.  Routing to provider for final review. Patient is agreeable to disposition. Will close encounter.

## 2023-03-31 NOTE — Progress Notes (Signed)
Patient presents for postop from [facility] 03/21/2023, bilateral salpingectomy, cystoscopy. She is doing well. No fevers, VB, dysuria or severe abdominal pain. Developed bothersome rash that started on Friday.  Has not slept for 2 days.  Feels like fire ants are crawling on her. No fevers, sore throat, VB, changes in soaps etc   LMP 02/22/2023 (Exact Date)   Abdomen: incisions with petechia near incisions and diffusely on abdomen and down both thighs  A/p PO from [facility] with allergic reaction Prednisone taper sent in May use atarax to sleep and for the itching.  Benadryl is not help her. Keep scheduled appointment. RTC sooner with any concerns  Dr. Karma Greaser

## 2023-04-09 ENCOUNTER — Ambulatory Visit (INDEPENDENT_AMBULATORY_CARE_PROVIDER_SITE_OTHER): Payer: BC Managed Care – PPO | Admitting: Obstetrics and Gynecology

## 2023-04-09 ENCOUNTER — Encounter: Payer: Self-pay | Admitting: Obstetrics and Gynecology

## 2023-04-09 VITALS — BP 110/80 | HR 72

## 2023-04-09 DIAGNOSIS — Z9889 Other specified postprocedural states: Secondary | ICD-10-CM

## 2023-04-09 DIAGNOSIS — Z09 Encounter for follow-up examination after completed treatment for conditions other than malignant neoplasm: Secondary | ICD-10-CM

## 2023-04-09 NOTE — Progress Notes (Signed)
Patient presents for 2 week postop from [facility], bilateral salpingectomy, cystoscopy. She is doing well. No fevers, VB, dysuria or severe abdominal pain. Rash has resolved with the prednisone.  She would like to see allergy testing at [facility]  BP 110/80   Pulse 72   LMP 02/22/2023 (Exact Date)   Abdomen: incisions I/c/d, NT, ND  A/p PO from [facility] 2 weeks doing well Encouraged no heavy lifting, pushing, pulling greater than 10 lbs for full 8 weeks 2. Pelvic rest for the entire 10 wks 3. RTC with any concerns or with heavy bleeding, fevers or severe abdominal pain.  Dr. Karma Greaser

## 2023-04-14 DIAGNOSIS — L308 Other specified dermatitis: Secondary | ICD-10-CM | POA: Diagnosis not present

## 2023-04-22 ENCOUNTER — Other Ambulatory Visit: Payer: Self-pay | Admitting: Obstetrics and Gynecology

## 2023-04-22 DIAGNOSIS — Z0000 Encounter for general adult medical examination without abnormal findings: Secondary | ICD-10-CM

## 2023-04-28 DIAGNOSIS — H53143 Visual discomfort, bilateral: Secondary | ICD-10-CM | POA: Diagnosis not present

## 2023-05-06 DIAGNOSIS — J069 Acute upper respiratory infection, unspecified: Secondary | ICD-10-CM | POA: Diagnosis not present

## 2023-05-06 DIAGNOSIS — R21 Rash and other nonspecific skin eruption: Secondary | ICD-10-CM | POA: Diagnosis not present

## 2023-05-07 ENCOUNTER — Encounter: Payer: Self-pay | Admitting: Obstetrics and Gynecology

## 2023-05-15 ENCOUNTER — Encounter: Payer: Self-pay | Admitting: Allergy

## 2023-05-15 ENCOUNTER — Other Ambulatory Visit: Payer: Self-pay

## 2023-05-15 ENCOUNTER — Ambulatory Visit (INDEPENDENT_AMBULATORY_CARE_PROVIDER_SITE_OTHER): Payer: BC Managed Care – PPO | Admitting: Allergy

## 2023-05-15 VITALS — BP 112/80 | HR 60 | Temp 98.6°F | Resp 18 | Ht 61.0 in | Wt 151.3 lb

## 2023-05-15 DIAGNOSIS — L509 Urticaria, unspecified: Secondary | ICD-10-CM

## 2023-05-15 NOTE — Patient Instructions (Signed)
 Recurrent Urticarial dermatitis Recurrent episodes of pruritic rash, responsive to prednisone but recurring after cessation. Rash migrates and is associated with tiny red bumps and itching. No associated angioedema, fever or joint pain. No clear triggers identified. Possible cholinergic urticaria given the appearance of pictures. -Order complete blood count, liver and kidney function tests, thyroid studies, anti-IgE receptor antibody, tryptase, environmental allergy panel, and red meat allergy testing. -Advise to have Zyrtec and Pepcid available at home for immediate use if symptoms recur. -If symptoms recur, recommend starting Zyrtec and Pepcid both 1 tab twice a day.  Max dosing of Zyrtec if 4 tabs/day. -If symptoms persist despite antihistamine therapy, consider adding Singulair. -If symptoms still persist despite triple therapy, consider Xolair as a next step, pending insurance approval.  -Topical therapies are usually not effective for these rashes -Hives can be caused by a variety of different triggers including illness/infection, foods, medications, stings, exercise, pressure, vibrations, extremes of temperature to name a few however majority of the time there is no identifiable trigger.  Stress also can exacerbate hives.  Other things that can worsen hives/itch/redness include histamine releasing agents like NSAIDs (ie. Ibuprofen, advil, aleve, motrin etc), opiates (ie. Oxycodone, percocet etc), alcohol (beer, wine, liquor) and large quantities of chocolate and strawberries.   Follow-up in 4-6 months or sooner if needed

## 2023-05-15 NOTE — Progress Notes (Signed)
 New Patient Note  RE: [name] MRN: [id number] DOB: [birth year] Date of Office Visit: 05/15/2023   Primary care provider: Laurann Montana, MD  Chief Complaint: Itchy rash  History of present illness: Katie Coffey is a 53 y.o. female presenting today for evaluation of allergies, rash. Discussed the use of AI scribe software for clinical note transcription with the patient, who gave verbal consent to proceed.  She has been experiencing recurrent episodes of an itchy rash since 2023, initially appearing on her arms. The rash consists of 'teeny tiny little bumps' that are intensely itchy and migratory, affecting her sleep as well. The second episode of this rash occurred in January.  It started on her stomach area and migrated down to her legs legs.   Multiple episodes there have been no changes in diet, detergents, body products or other potential triggers. The onset of the recent January rash developed about 9 to 10 days after she had a laparoscopic hysterectomy performed on March 21, 2023.  She did have surgical glue used to close her laparoscopic openings.  Prednisone has been used to treat the rash both episodes, initially clearing it, but the rash recurred shortly after stopping the medication. A second round of prednisone was administered with similar results. In January 2025 episode, a longer course of prednisone was prescribed, which resolved the rash.  In 2023, she also experienced high blood pressure concurrent with the rash, leading to emergency room visits and subsequent management with blood pressure medication.  At that time she did have extensive workup Was reassuring.  During the recent rash episode, her blood pressure was slightly elevated but she has been on blood pressure medications. She questions if it would have been higher without medication.  No joint aches or pains with the rashes.  Rash does not leave bruising or cause swelling in areas like the lips or eyes.   Denies fever or preceding illnesses or any vaccinations prior.  No bites or stings.     Review of systems: 10pt ROS negative unless noted above in HPI  Past medical history: Past Medical History:  Diagnosis Date   Adenomyosis    Dysmenorrhea    Fibrocystic breast    History of DVT (deep vein thrombosis) 10/11/2017   left gastrocemius in setting OCP and was on a 10 hour flight),  pt stated completed xarelto,  (03-10-2023 never had a clot prior to or since)   Hypertension    Hypothyroidism    IDA (iron deficiency anemia)    Insomnia    Menorrhagia    OSA (obstructive sleep apnea) 09/2021   followed by dr r. turner;  (03-10-2023 pt stated no cpap)  HST 09-28-2021 in epic , mild OSA ,  AHI of 8.9/h and lowest O2 saturations 87% and RDI of 12/hr   PSVT (paroxysmal supraventricular tachycardia) [facility])    cardiology--- dr t . Mayford Knife;   event monitor 10-16-2021  2 episodes SVT longes 17 beats,  NSR w/ PVCs / PACs;  normal echo ef 55-60%, mild MR;   CTA/ CTC w/ calcium score zero and no evidence CAD   Uterine fibroid    Wears contact lenses     Past surgical history: Past Surgical History:  Procedure Laterality Date   CESAREAN SECTION  03/30/1999   @[facility]  by dr r. neal   CESAREAN SECTION WITH BILATERAL TUBAL LIGATION Bilateral 07/13/2003   @[facility]  by dr r. neal   CHOLECYSTECTOMY N/A 09/16/2014   Procedure: LAPAROSCOPIC CHOLECYSTECTOMY WITH INTRAOPERATIVE  CHOLANGIOGRAM;  Surgeon: Harriette Bouillon, MD;  Location: [facility];  Service: General;  Laterality: N/A;   CYSTOSCOPY N/A 03/21/2023   Procedure: CYSTOSCOPY;  Surgeon: Earley Favor, MD;  Location: [facility];  Service: Gynecology;  Laterality: N/A;   DILATION AND CURETTAGE OF UTERUS     x2  for missed ab  in 1990s   OVARIAN CYST SURGERY  01/1990   laparotomy   REPEAT CESAREAN SECTION  01/18/2001   @[facility]  by dr r. neal   ROBOTIC ASSISTED LAPAROSCOPIC HYSTERECTOMY AND SALPINGECTOMY Bilateral 03/21/2023    Procedure: XI ROBOTIC ASSISTED LAPAROSCOPIC HYSTERECTOMY AND SALPINGECTOMY;  Surgeon: Earley Favor, MD;  Location: [facility];  Service: Gynecology;  Laterality: Bilateral;    Family history:  Family History  Problem Relation Age of Onset   Asthma Mother    Alzheimer's disease Mother    Breast cancer Neg Hx     Social history: Lives in a house without carpeting with gas heating and central cooling.  No pets in the home.  There is no concern for water damage, mildew or roaches in the home.  She has an events Interior and spatial designer.  Denies a smoking history.   Medication List: Current Outpatient Medications  Medication Sig Dispense Refill   Ascorbic Acid (VITAMIN C) 500 MG CAPS Take 1 capsule by mouth at bedtime.     B Complex-C (SUPER B COMPLEX PO) Take 1 capsule by mouth daily.     Calcium Carb-Cholecalciferol (CALCIUM 600+D) 600-20 MG-MCG TABS Take 1 tablet by mouth at bedtime.     Collagen-Vitamin C-Biotin (COLLAGEN 1500/C) 500-50-0.8 MG CAPS Take by mouth 2 (two) times daily. 2 capsules in am and 1 capsul in evening     ferrous sulfate 325 (65 FE) MG EC tablet Take 325 mg by mouth at bedtime.     ibuprofen (ADVIL) 800 MG tablet Take 1 tablet (800 mg total) by mouth every 8 (eight) hours as needed. 30 tablet 1   levothyroxine (SYNTHROID) 75 MCG tablet Take 75 mcg by mouth daily before breakfast. PER PT BRAND NAME ONLY     MAGNESIUM PO Take by mouth 2 (two) times daily. 144 mg each tablet/  pt takes 2 tabs in am and one tab in evening     Multiple Vitamins-Minerals (CENTRUM SILVER 50+WOMEN PO) Take 1 capsule by mouth daily.     Biotin 1000 MCG tablet Take 1,000 mcg by mouth at bedtime. (Patient not taking: Reported on 05/15/2023)     cetirizine (ZYRTEC) 10 MG tablet Take 10 mg by mouth daily as needed for allergies. (Patient not taking: Reported on 04/09/2023)     diphenhydrAMINE HCl (BENADRYL PO) Take by mouth. (Patient not taking: Reported on 05/15/2023)     metoprolol  succinate (TOPROL-XL) 25 MG 24 hr tablet TAKE 1 TABLET(25 MG) BY MOUTH AT BEDTIME (Patient not taking: Reported on 05/15/2023) 90 tablet 0   triamcinolone cream (KENALOG) 0.1 % 1 application to affected area Externally Twice a day as needed (Patient not taking: Reported on 05/15/2023)     No current facility-administered medications for this visit.    Known medication allergies: Allergies  Allergen Reactions   Other Hives    PER PT ONLY WALNUTS  (OTHER TREE NUTS OKAY)   Duraproxin Es [Camphor-Menthol-Methyl Sal] Rash     Physical examination: Blood pressure 112/80, pulse 60, temperature 98.6 F (37 C), temperature source Temporal, resp. rate 18, height 5\' 1"  (1.549 m), weight 151 lb 4.8 oz (68.6  kg), last menstrual period 02/22/2023, SpO2 96%.  General: Alert, interactive, in no acute distress. HEENT: PERRLA, TMs pearly gray, turbinates non-edematous without discharge, post-pharynx non erythematous. Neck: Supple without lymphadenopathy. Lungs: Clear to auscultation without wheezing, rhonchi or rales. {no increased work of breathing. CV: Normal S1, S2 without murmurs. Abdomen: Nondistended, nontender. Skin: Warm and dry, without lesions or rashes. Extremities:  No clubbing, cyanosis or edema. Neuro:   Grossly intact.  Diagnositics/Labs: None today  Assessment and plan:   Recurrent Urticarial dermatitis Recurrent episodes of pruritic rash, responsive to prednisone but recurring after cessation. Rash migrates and is associated with tiny red bumps and itching. No associated angioedema, fever or joint pain. No clear triggers identified. Possible cholinergic urticaria given the appearance of pictures. -Order complete blood count, liver and kidney function tests, thyroid studies, anti-IgE receptor antibody, tryptase, environmental allergy panel, and red meat allergy testing. -Advise to have Zyrtec and Pepcid available at home for immediate use if symptoms recur. -If symptoms recur,  recommend starting Zyrtec and Pepcid both 1 tab twice a day.  Max dosing of Zyrtec if 4 tabs/day. -If symptoms persist despite antihistamine therapy, consider adding Singulair. -If symptoms still persist despite triple therapy, consider Xolair as a next step, pending insurance approval.  -Topical therapies are usually not effective for these rashes -Hives can be caused by a variety of different triggers including illness/infection, foods, medications, stings, exercise, pressure, vibrations, extremes of temperature to name a few however majority of the time there is no identifiable trigger.  Stress also can exacerbate hives.  Other things that can worsen hives/itch/redness include histamine releasing agents like NSAIDs (ie. Ibuprofen, advil, aleve, motrin etc), opiates (ie. Oxycodone, percocet etc), alcohol (beer, wine, liquor) and large quantities of chocolate and strawberries.   Follow-up in 4-6 months or sooner if needed  I appreciate the opportunity to take part in Mima's care. Please do not hesitate to contact me with questions.  Sincerely,   Margo Aye, MD Allergy/Immunology [facility]

## 2023-05-27 LAB — CBC WITH DIFFERENTIAL/PLATELET
Basophils Absolute: 0 10*3/uL (ref 0.0–0.2)
Basos: 1 %
EOS (ABSOLUTE): 0.2 10*3/uL (ref 0.0–0.4)
Eos: 3 %
Hematocrit: 46.4 % (ref 34.0–46.6)
Hemoglobin: 15.5 g/dL (ref 11.1–15.9)
Immature Grans (Abs): 0 10*3/uL (ref 0.0–0.1)
Immature Granulocytes: 0 %
Lymphocytes Absolute: 1.5 10*3/uL (ref 0.7–3.1)
Lymphs: 27 %
MCH: 32 pg (ref 26.6–33.0)
MCHC: 33.4 g/dL (ref 31.5–35.7)
MCV: 96 fL (ref 79–97)
Monocytes Absolute: 0.5 10*3/uL (ref 0.1–0.9)
Monocytes: 8 %
Neutrophils Absolute: 3.3 10*3/uL (ref 1.4–7.0)
Neutrophils: 61 %
Platelets: 296 10*3/uL (ref 150–450)
RBC: 4.84 x10E6/uL (ref 3.77–5.28)
RDW: 12.9 % (ref 11.7–15.4)
WBC: 5.4 10*3/uL (ref 3.4–10.8)

## 2023-05-27 LAB — COMPREHENSIVE METABOLIC PANEL
ALT: 11 IU/L (ref 0–32)
AST: 24 IU/L (ref 0–40)
Albumin: 4.5 g/dL (ref 3.8–4.9)
Alkaline Phosphatase: 60 IU/L (ref 44–121)
BUN/Creatinine Ratio: 15 (ref 9–23)
BUN: 13 mg/dL (ref 6–24)
Bilirubin Total: 0.4 mg/dL (ref 0.0–1.2)
CO2: 24 mmol/L (ref 20–29)
Calcium: 9.8 mg/dL (ref 8.7–10.2)
Chloride: 102 mmol/L (ref 96–106)
Creatinine, Ser: 0.88 mg/dL (ref 0.57–1.00)
Globulin, Total: 2.4 g/dL (ref 1.5–4.5)
Glucose: 77 mg/dL (ref 70–99)
Potassium: 4.7 mmol/L (ref 3.5–5.2)
Sodium: 140 mmol/L (ref 134–144)
Total Protein: 6.9 g/dL (ref 6.0–8.5)
eGFR: 80 mL/min/{1.73_m2} (ref >59)

## 2023-05-27 LAB — ALLERGENS W/TOTAL IGE AREA 2
Alternaria Alternata IgE: <0.1 kU/L
Aspergillus Fumigatus IgE: <0.1 kU/L
Bermuda Grass IgE: 0.62 kU/L — AB
Cat Dander IgE: <0.1 kU/L
Cedar, Mountain IgE: <0.1 kU/L
Cladosporium Herbarum IgE: <0.1 kU/L
Cockroach, German IgE: <0.1 kU/L
Common Silver Birch IgE: <0.1 kU/L
Cottonwood IgE: <0.1 kU/L
D Farinae IgE: <0.1 kU/L
D Pteronyssinus IgE: <0.1 kU/L
Dog Dander IgE: <0.1 kU/L
Elm, American IgE: <0.1 kU/L
Johnson Grass IgE: 1.52 kU/L — AB
Maple/Box Elder IgE: <0.1 kU/L
Mouse Urine IgE: <0.1 kU/L
Oak, White IgE: <0.1 kU/L
Pecan, Hickory IgE: <0.1 kU/L
Penicillium Chrysogen IgE: <0.1 kU/L
Pigweed, Rough IgE: <0.1 kU/L
Ragweed, Short IgE: <0.1 kU/L
Sheep Sorrel IgE Qn: <0.1 kU/L
Timothy Grass IgE: 3.71 kU/L — AB
White Mulberry IgE: <0.1 kU/L

## 2023-05-27 LAB — TRYPTASE: Tryptase: 7.1 ug/L (ref 2.2–13.2)

## 2023-05-27 LAB — CHRONIC URTICARIA: cu index: 2.9 (ref <10)

## 2023-05-27 LAB — THYROID ANTIBODIES (THYROPEROXIDASE & THYROGLOBULIN)
Thyroglobulin Antibody: <1 [IU]/mL (ref 0.0–0.9)
Thyroperoxidase Ab SerPl-aCnc: 45 [IU]/mL — ABNORMAL HIGH (ref 0–34)

## 2023-05-27 LAB — ALPHA-GAL PANEL
Allergen Lamb IgE: <0.1 kU/L
Beef IgE: <0.1 kU/L
IgE (Immunoglobulin E), Serum: 36 [IU]/mL (ref 6–495)
O215-IgE Alpha-Gal: <0.1 kU/L
Pork IgE: <0.1 kU/L

## 2023-05-28 ENCOUNTER — Ambulatory Visit (INDEPENDENT_AMBULATORY_CARE_PROVIDER_SITE_OTHER): Payer: BC Managed Care – PPO | Admitting: Obstetrics and Gynecology

## 2023-05-28 ENCOUNTER — Encounter: Payer: Self-pay | Admitting: Obstetrics and Gynecology

## 2023-05-28 VITALS — BP 118/80 | HR 60

## 2023-05-28 DIAGNOSIS — Z09 Encounter for follow-up examination after completed treatment for conditions other than malignant neoplasm: Secondary | ICD-10-CM

## 2023-05-28 NOTE — Progress Notes (Signed)
 Patient presents for 10 week postop from [facility], bilateral salpingectomy, cystoscopy. She is doing well. No fevers, VB, dysuria or severe abdominal pain.  BP 118/80   Pulse 60   LMP 02/22/2023 (Exact Date)   SpO2 99%   SVE: sutures seen and dissolving, normal discharge, no bleeding, good vaginal support  A/p PO from [facility] 10 weeks doing well Resume activities 2. Pelvic rest for additional 3 wks 3. Encouraged annual mammograms and resume annual care with PCP  Dr. Karma Greaser

## 2023-05-29 ENCOUNTER — Encounter: Payer: Self-pay | Admitting: Allergy

## 2023-06-10 ENCOUNTER — Ambulatory Visit
Admission: RE | Admit: 2023-06-10 | Discharge: 2023-06-10 | Disposition: A | Discharge status: Home / Self Care | Payer: BC Managed Care – PPO | Source: Ambulatory Visit | Attending: Obstetrics and Gynecology | Admitting: Obstetrics and Gynecology

## 2023-06-10 DIAGNOSIS — Z0000 Encounter for general adult medical examination without abnormal findings: Secondary | ICD-10-CM

## 2023-06-10 DIAGNOSIS — Z1231 Encounter for screening mammogram for malignant neoplasm of breast: Secondary | ICD-10-CM | POA: Diagnosis not present

## 2023-06-12 ENCOUNTER — Encounter: Payer: Self-pay | Admitting: Obstetrics and Gynecology

## 2023-06-13 ENCOUNTER — Other Ambulatory Visit: Payer: Self-pay | Admitting: Obstetrics and Gynecology

## 2023-06-13 DIAGNOSIS — R928 Other abnormal and inconclusive findings on diagnostic imaging of breast: Secondary | ICD-10-CM

## 2023-06-26 ENCOUNTER — Ambulatory Visit
Admission: RE | Admit: 2023-06-26 | Discharge: 2023-06-26 | Disposition: A | Discharge status: Home / Self Care | Source: Ambulatory Visit | Attending: Obstetrics and Gynecology | Admitting: Obstetrics and Gynecology

## 2023-06-26 ENCOUNTER — Encounter: Payer: Self-pay | Admitting: Obstetrics and Gynecology

## 2023-06-26 DIAGNOSIS — R928 Other abnormal and inconclusive findings on diagnostic imaging of breast: Secondary | ICD-10-CM

## 2023-06-26 DIAGNOSIS — N6321 Unspecified lump in the left breast, upper outer quadrant: Secondary | ICD-10-CM | POA: Diagnosis not present

## 2023-07-03 DIAGNOSIS — E039 Hypothyroidism, unspecified: Secondary | ICD-10-CM | POA: Diagnosis not present

## 2023-07-08 DIAGNOSIS — E039 Hypothyroidism, unspecified: Secondary | ICD-10-CM | POA: Diagnosis not present

## 2023-07-09 ENCOUNTER — Encounter: Payer: Self-pay | Admitting: Obstetrics and Gynecology

## 2023-07-09 ENCOUNTER — Ambulatory Visit (INDEPENDENT_AMBULATORY_CARE_PROVIDER_SITE_OTHER): Admitting: Obstetrics and Gynecology

## 2023-07-09 VITALS — BP 110/72 | HR 68 | Resp 16 | Ht 61.0 in | Wt 143.0 lb

## 2023-07-09 DIAGNOSIS — Z0000 Encounter for general adult medical examination without abnormal findings: Secondary | ICD-10-CM

## 2023-07-09 DIAGNOSIS — Z01419 Encounter for gynecological examination (general) (routine) without abnormal findings: Secondary | ICD-10-CM | POA: Diagnosis not present

## 2023-07-09 DIAGNOSIS — Z1331 Encounter for screening for depression: Secondary | ICD-10-CM

## 2023-07-09 NOTE — Progress Notes (Signed)
 53 y.o. y.o. female here for annual exam. Patient's last menstrual period was 02/22/2023 (exact date).   H/o DVT  Trying to work on weight loss but is at a plateau with weight. Reports hair changes and damage   Patient's last menstrual period was 03/21/23 [facility], bilateral salpingectomy, cystoscopy.  Ovaries intact.   Sexually active: Yes.    Exercising: Yes.    Walking  Smoker:  no  Health Maintenance: Pap:  06/27/21 WNL Hr HPV Neg 03/23/14 WNL History of abnormal Pap:  yes follow up was normal  MMG:  06/26/23 Bi-rads 2 neg Us  with simple cyst BMD:   none no family history. No current bone treatment or estrogen due to DVT history.  Considering intrarosa vaginal DHEA information provided for dyspareunia vaginal dryness No GI.GU complaints Colonoscopy: 2018 followed by her pcp, f/u in 10 years  TDaP:  08/24/15 Gardasil: none  Seeing PMD for annual blood work. Has hypothryoidism Encouraged patient to stop iron. Does not require it, since [facility]. Body mass index is 27.02 kg/m.     07/09/2023    9:36 AM  Depression screen PHQ 2/9  Decreased Interest 0  Down, Depressed, Hopeless 0  PHQ - 2 Score 0    Blood pressure 110/72, pulse 68, resp. rate 16, height 5\' 1"  (1.549 m), weight 143 lb (64.9 kg), last menstrual period 02/22/2023.     Component Value Date/Time   DIAGPAP (A) 01/16/2023 1043    - Atypical squamous cells of undetermined significance (ASC-US )   DIAGPAP  06/27/2021 1052    - Negative for Intraepithelial Lesions or Malignancy (NILM)   DIAGPAP - Benign reactive/reparative changes 06/27/2021 1052   HPVHIGH Negative 01/16/2023 1043   HPVHIGH Negative 06/27/2021 1052   ADEQPAP  01/16/2023 1043    Satisfactory for evaluation; transformation zone component PRESENT.   ADEQPAP  06/27/2021 1052    Satisfactory for evaluation; transformation zone component PRESENT.    GYN HISTORY:    Component Value Date/Time   DIAGPAP (A) 01/16/2023 1043    - Atypical squamous cells of  undetermined significance (ASC-US )   DIAGPAP  06/27/2021 1052    - Negative for Intraepithelial Lesions or Malignancy (NILM)   DIAGPAP - Benign reactive/reparative changes 06/27/2021 1052   HPVHIGH Negative 01/16/2023 1043   HPVHIGH Negative 06/27/2021 1052   ADEQPAP  01/16/2023 1043    Satisfactory for evaluation; transformation zone component PRESENT.   ADEQPAP  06/27/2021 1052    Satisfactory for evaluation; transformation zone component PRESENT.    OB History  Gravida Para Term Preterm AB Living  6 3 3  3 3   SAB IAB Ectopic Multiple Live Births  3    3    # Outcome Date GA Lbr Len/2nd Weight Sex Type Anes PTL Lv  6 SAB           5 Term           4 Term     F CS-Unspec   LIV  3 Term     M CS-Unspec   LIV  2 SAB           1 SAB             Past Medical History:  Diagnosis Date   Adenomyosis    Dysmenorrhea    Fibrocystic breast    History of DVT (deep vein thrombosis) 10/11/2017   left gastrocemius in setting OCP and was on a 10 hour flight),  pt stated completed xarelto ,  (03-10-2023 never  had a clot prior to or since)   Hypertension    Hypothyroidism    IDA (iron deficiency anemia)    Insomnia    Menorrhagia    OSA (obstructive sleep apnea) 09/2021   followed by dr r. turner;  (03-10-2023 pt stated no cpap)  HST 09-28-2021 in epic , mild OSA ,  AHI of 8.9/h and lowest O2 saturations 87% and RDI of 12/hr   PSVT (paroxysmal supraventricular tachycardia) [facility])    cardiology--- dr t . Micael Adas;   event monitor 10-16-2021  2 episodes SVT longes 17 beats,  NSR w/ PVCs / PACs;  normal echo ef 55-60%, mild MR;   CTA/ CTC w/ calcium score zero and no evidence CAD   Uterine fibroid    Wears contact lenses     Past Surgical History:  Procedure Laterality Date   CESAREAN SECTION  03/30/1999   @[facility]  by dr r. neal   CESAREAN SECTION WITH BILATERAL TUBAL LIGATION Bilateral 07/13/2003   @[facility]  by dr r. neal   CHOLECYSTECTOMY N/A 09/16/2014   Procedure: LAPAROSCOPIC CHOLECYSTECTOMY  WITH INTRAOPERATIVE CHOLANGIOGRAM;  Surgeon: Sim Dryer, MD;  Location: [facility];  Service: General;  Laterality: N/A;   CYSTOSCOPY N/A 03/21/2023   Procedure: CYSTOSCOPY;  Surgeon: Reinaldo Caras, MD;  Location: [facility];  Service: Gynecology;  Laterality: N/A;   DILATION AND CURETTAGE OF UTERUS     x2  for missed ab  in 1990s   OVARIAN CYST SURGERY  01/1990   laparotomy   REPEAT CESAREAN SECTION  01/18/2001   @[facility]  by dr r. neal   ROBOTIC ASSISTED LAPAROSCOPIC HYSTERECTOMY AND SALPINGECTOMY Bilateral 03/21/2023   Procedure: XI ROBOTIC ASSISTED LAPAROSCOPIC HYSTERECTOMY AND SALPINGECTOMY;  Surgeon: Reinaldo Caras, MD;  Location: [facility];  Service: Gynecology;  Laterality: Bilateral;    Current Outpatient Medications on File Prior to Visit  Medication Sig Dispense Refill   Ascorbic Acid (VITAMIN C) 500 MG CAPS Take 1 capsule by mouth at bedtime.     B Complex-C (SUPER B COMPLEX PO) Take 1 capsule by mouth daily.     Calcium Carb-Cholecalciferol (CALCIUM 600+D) 600-20 MG-MCG TABS Take 1 tablet by mouth at bedtime.     cetirizine (ZYRTEC) 10 MG tablet Take 10 mg by mouth daily as needed for allergies.     Collagen-Vitamin C-Biotin (COLLAGEN 1500/C) 500-50-0.8 MG CAPS Take by mouth 2 (two) times daily. 2 capsules in am and 1 capsul in evening     ferrous sulfate 325 (65 FE) MG EC tablet Take 325 mg by mouth at bedtime.     FIBER PO Take by mouth.     ibuprofen  (ADVIL ) 800 MG tablet Take 1 tablet (800 mg total) by mouth every 8 (eight) hours as needed. 30 tablet 1   levothyroxine (SYNTHROID) 75 MCG tablet Take 75 mcg by mouth daily before breakfast. PER PT BRAND NAME ONLY     MAGNESIUM PO Take by mouth 2 (two) times daily. 144 mg each tablet/  pt takes 2 tabs in am and one tab in evening     metoprolol  succinate (TOPROL -XL) 25 MG 24 hr tablet TAKE 1 TABLET(25 MG) BY MOUTH AT BEDTIME 90 tablet 0   Multiple Vitamins-Minerals  (CENTRUM SILVER 50+WOMEN PO) Take 1 capsule by mouth daily.     Specialty Vitamins Products (BIOTIN PLUS KERATIN PO) Take by mouth.     No current facility-administered medications on file prior to visit.    Social History  Socioeconomic History   Marital status: Married    Spouse name: Not on file   Number of children: Not on file   Years of education: Not on file   Highest education level: Not on file  Occupational History   Not on file  Tobacco Use   Smoking status: Never    Passive exposure: Never   Smokeless tobacco: Never  Vaping Use   Vaping status: Never Used  Substance and Sexual Activity   Alcohol use: Yes    Comment: occasional   Drug use: Never   Sexual activity: Yes    Partners: Male    Birth control/protection: Surgical    Comment: BTL, hysterectomy  Other Topics Concern   Not on file  Social History Narrative   Not on file   Social Drivers of Health   Financial Resource Strain: Not on file  Food Insecurity: Not on file  Transportation Needs: Not on file  Physical Activity: Not on file  Stress: Not on file  Social Connections: Not on file  Intimate Partner Violence: Not on file    Family History  Problem Relation Age of Onset   Asthma Mother    Alzheimer's disease Mother    Breast cancer Neg Hx      Allergies  Allergen Reactions   Other Hives    PER PT ONLY WALNUTS  (OTHER TREE NUTS OKAY)   Duraproxin Es [Camphor-Menthol-Methyl Sal] Rash      Patient's last menstrual period was Patient's last menstrual period was 02/22/2023 (exact date)..             Review of Systems Alls systems reviewed and are negative.     Physical Exam Constitutional:      Appearance: Normal appearance.  Genitourinary:     Vulva normal.     No lesions in the vagina.     Genitourinary Comments: Good vaginal support     Right Labia: No rash, lesions or skin changes.    Left Labia: No lesions, skin changes or rash.    Vaginal cuff intact.    No vaginal  discharge or tenderness.     No vaginal prolapse present.    No vaginal atrophy present.     Right Adnexa: not tender and no mass present.    Left Adnexa: not tender and no mass present.    Cervix is absent.     Uterus is absent.  Breasts:    Right: Normal.     Left: Normal.  HENT:     Head: Normocephalic.  Neck:     Thyroid : No thyroid  mass, thyromegaly or thyroid  tenderness.  Cardiovascular:     Rate and Rhythm: Normal rate and regular rhythm.     Heart sounds: Normal heart sounds, S1 normal and S2 normal.  Pulmonary:     Effort: Pulmonary effort is normal.     Breath sounds: Normal breath sounds and air entry.  Abdominal:     General: There is no distension.     Palpations: Abdomen is soft. There is no mass.     Tenderness: There is no abdominal tenderness. There is no guarding or rebound.  Musculoskeletal:        General: Normal range of motion.     Cervical back: Full passive range of motion without pain, normal range of motion and neck supple. No tenderness.     Right lower leg: No edema.     Left lower leg: No edema.  Neurological:     Mental  Status: She is alert.  Skin:    General: Skin is warm.  Psychiatric:        Mood and Affect: Mood normal.        Behavior: Behavior normal.        Thought Content: Thought content normal.  Vitals and nursing note reviewed. Exam conducted with a chaperone present.       A:         Well Woman GYN exam                             P:        Pap smear not indicated Encouraged annual mammogram screening Colon cancer screening up-to-date DXA not indicated Labs and immunizations to do with PMD Discussed breast self exams Encouraged healthy lifestyle practices Encouraged Vit D and Calcium   No follow-ups on file.  Reinaldo Caras

## 2023-08-04 ENCOUNTER — Encounter: Payer: Self-pay | Admitting: Allergy

## 2023-08-05 DIAGNOSIS — I10 Essential (primary) hypertension: Secondary | ICD-10-CM | POA: Diagnosis not present

## 2023-08-05 DIAGNOSIS — L509 Urticaria, unspecified: Secondary | ICD-10-CM | POA: Diagnosis not present

## 2023-08-05 DIAGNOSIS — Z862 Personal history of diseases of the blood and blood-forming organs and certain disorders involving the immune mechanism: Secondary | ICD-10-CM | POA: Diagnosis not present

## 2023-08-05 DIAGNOSIS — E039 Hypothyroidism, unspecified: Secondary | ICD-10-CM | POA: Diagnosis not present

## 2023-08-05 DIAGNOSIS — E663 Overweight: Secondary | ICD-10-CM | POA: Diagnosis not present

## 2023-09-11 IMAGING — MG MM DIGITAL SCREENING BILAT W/ TOMO AND CAD
8 series · 8 of 24 positions shown · non-contrast
Comparison: Previous exam(s).

CLINICAL DATA: Screening.

EXAM:
DIGITAL SCREENING BILATERAL MAMMOGRAM WITH TOMOSYNTHESIS AND CAD
TECHNIQUE: Bilateral screening digital craniocaudal and mediolateral oblique
mammograms were obtained. Bilateral screening digital breast
tomosynthesis was performed. The images were evaluated with
computer-aided detection.

[L CC synth-2D]
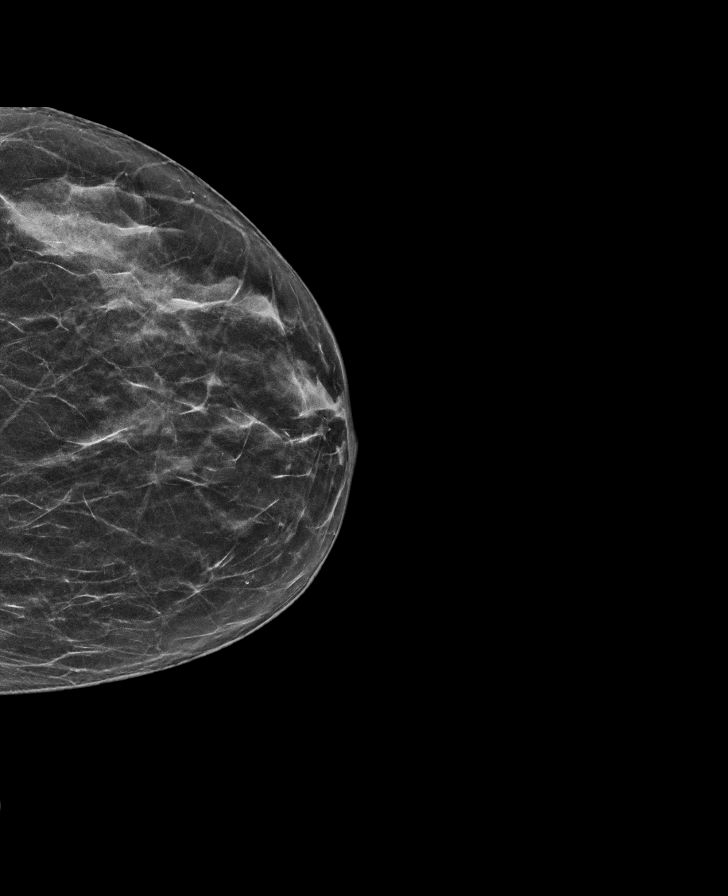

[L MLO synth-2D]
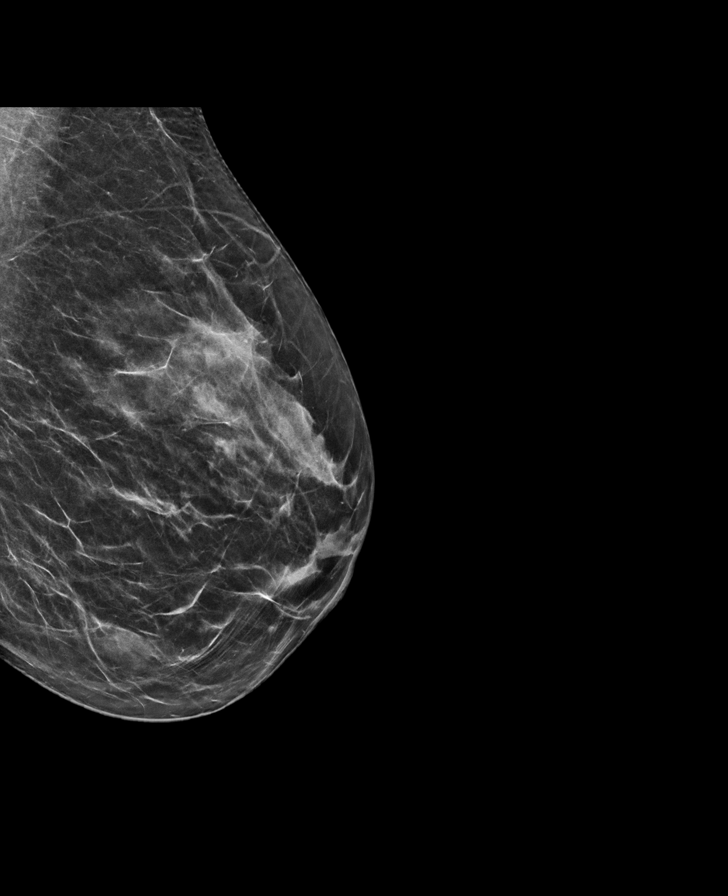

[R MLO synth-2D]
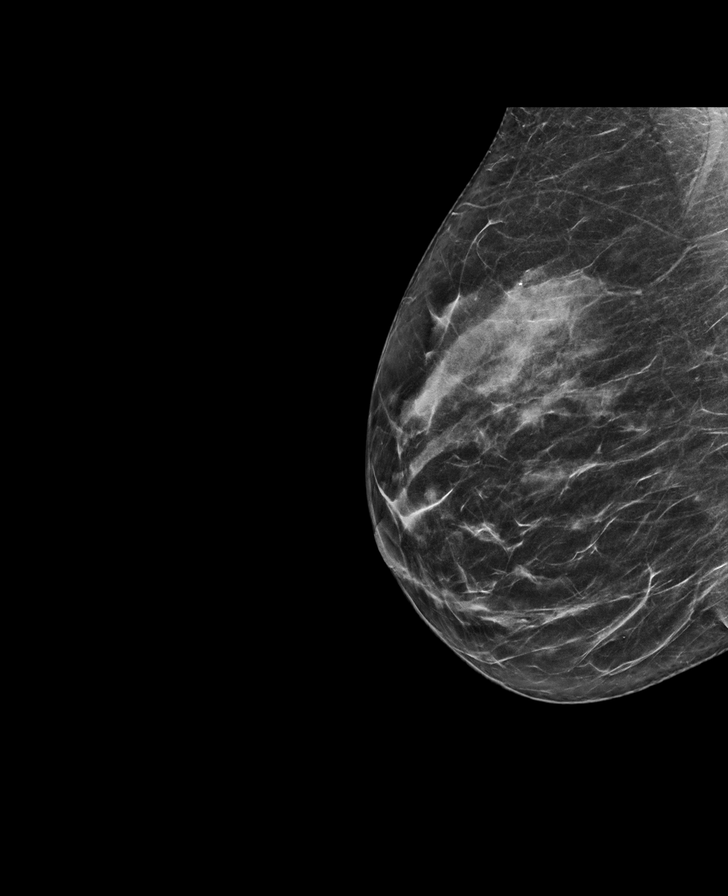

[R CC synth-2D]
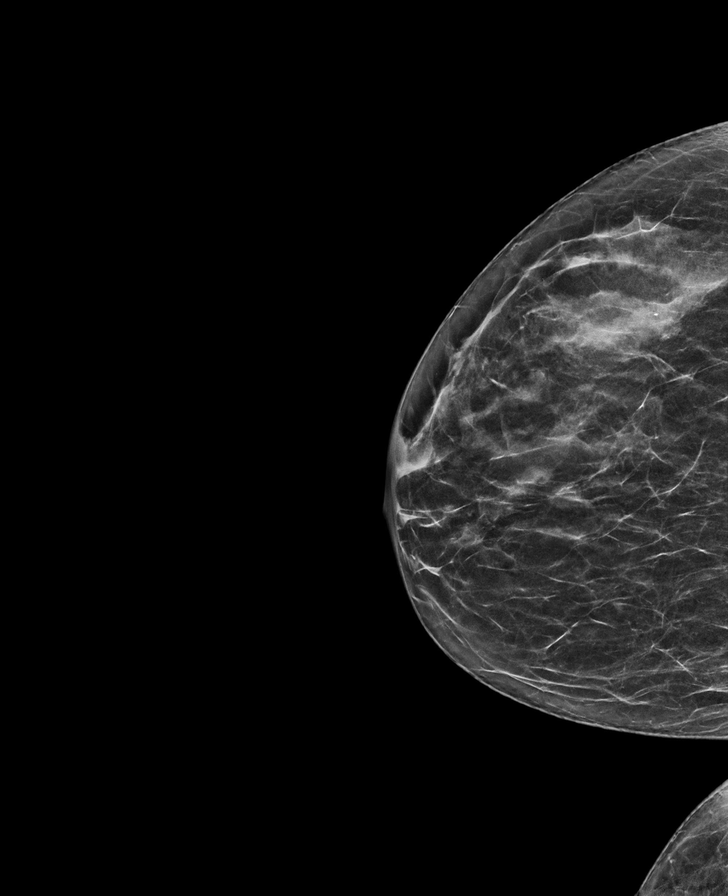

[R MLO tomo · tomo slice 33/64.0]
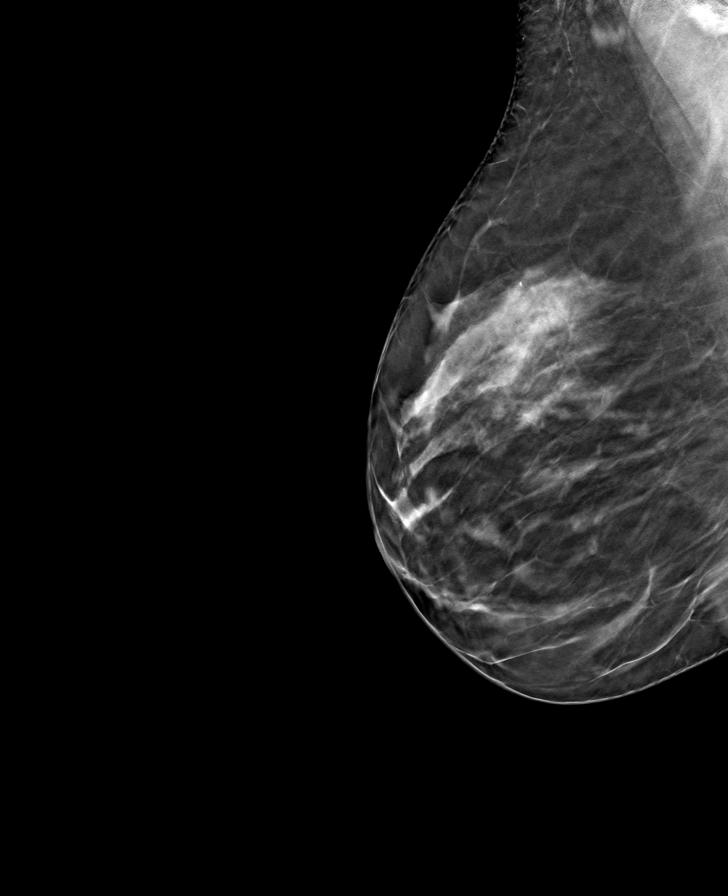

[L CC tomo · tomo slice 29/56.0]
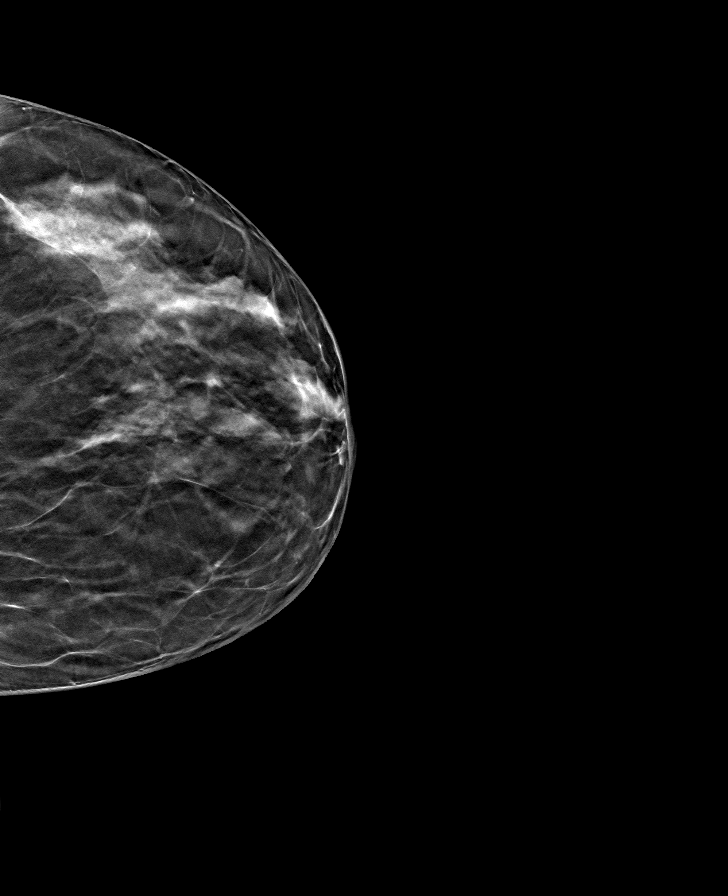

[R CC tomo · tomo slice 29/57.0]
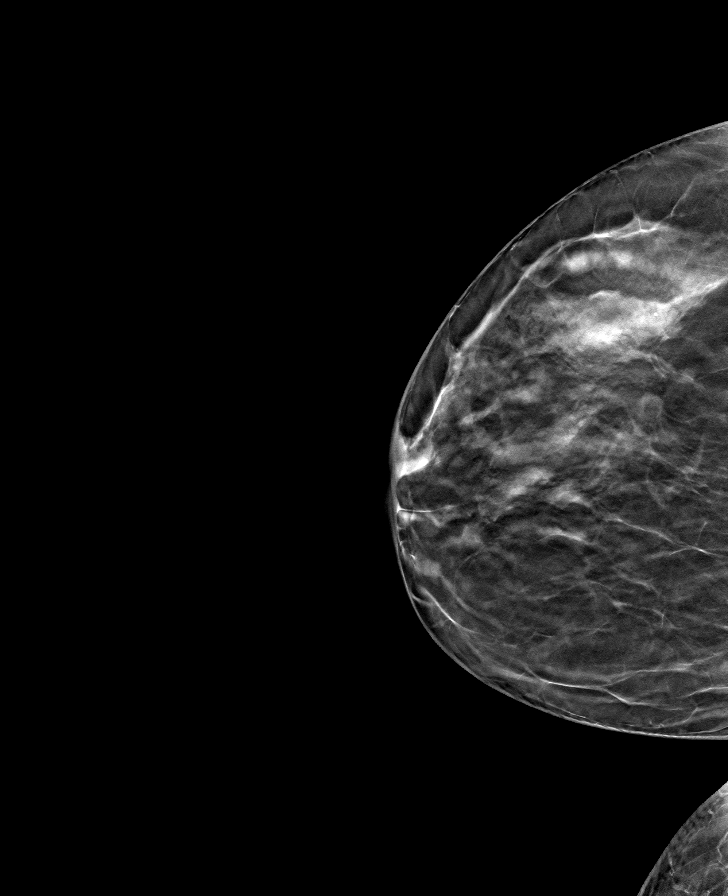

[L MLO tomo · tomo slice 31/60.0]
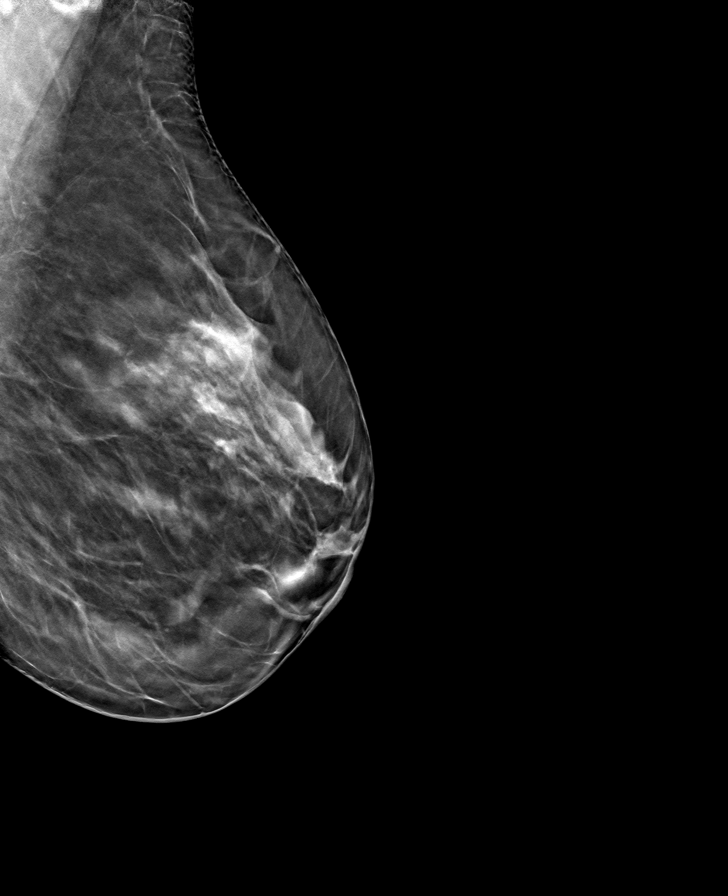

[8 of 24 positions shown; findings below may reference images not displayed]

ACR Breast Density Category c: The breast tissue is heterogeneously
dense, which may obscure small masses.
FINDINGS: There are no findings suspicious for malignancy.
IMPRESSION: No mammographic evidence of malignancy. A result letter of this
screening mammogram will be mailed directly to the patient.

RECOMMENDATION:
Screening mammogram in one year. (Code:Q3-W-BC3)

BI-RADS CATEGORY  1: Negative.

## 2023-11-20 ENCOUNTER — Ambulatory Visit: Admitting: Allergy

## 2023-11-28 ENCOUNTER — Encounter: Payer: Self-pay | Admitting: Radiology

## 2023-11-28 ENCOUNTER — Ambulatory Visit: Admitting: Radiology

## 2023-11-28 VITALS — BP 116/78 | HR 60 | Wt 148.0 lb

## 2023-11-28 DIAGNOSIS — N951 Menopausal and female climacteric states: Secondary | ICD-10-CM | POA: Diagnosis not present

## 2023-11-28 DIAGNOSIS — R6882 Decreased libido: Secondary | ICD-10-CM

## 2023-11-28 DIAGNOSIS — E039 Hypothyroidism, unspecified: Secondary | ICD-10-CM | POA: Diagnosis not present

## 2023-11-28 NOTE — Progress Notes (Signed)
   Katie Coffey [birth year] [id number]   History: Postmenopausal 53 y.o. presents to discuss hormones. Complains of decreased libido, inability to lose weight, hot flashes. Symptoms began within the last year. Hx of DVT after long air travel while on OCPs in 2019. No clotting factors. Not currently on any anticoagulation.   Gynecologic History Postmenopausal Hyst 03/2023 for heavy bleeding  Obstetric History OB History  Gravida Para Term Preterm AB Living  6 3 3  3 3   SAB IAB Ectopic Multiple Live Births  3    3    # Outcome Date GA Lbr Len/2nd Weight Sex Type Anes PTL Lv  6 SAB           5 Term           4 Term     F CS-Unspec   LIV  3 Term     M CS-Unspec   LIV  2 SAB           1 SAB               The following portions of the patient's history were reviewed and updated as appropriate: allergies, current medications, past family history, past medical history, past social history, past surgical history, and problem list.  Review of Systems Pertinent items noted in HPI and remainder of comprehensive ROS otherwise negative.  Past medical history, past surgical history, family history and social history were all reviewed and documented in the EPIC chart.  Exam:  Vitals:   11/28/23 0917  BP: 116/78  Pulse: 60  SpO2: 96%  Weight: 148 lb (67.1 kg)   Body mass index is 27.96 kg/m.   Assessment/Plan:   1. Low libido (Primary) - Testos,Total,Free and SHBG (Female)  2. Menopausal symptoms - Estradiol   Will contact with result and manage accordingly. Discussed it may be safe for her to use a transdermal estrogen with her history of DVT because she does not have other risk factors.  Jona Zappone B WHNP-BC, 9:40 AM 11/28/2023

## 2023-12-04 LAB — TESTOS,TOTAL,FREE AND SHBG (FEMALE)
Free Testosterone: 1.4 pg/mL (ref 0.1–6.4)
Sex Hormone Binding: 76 nmol/L (ref 17–124)
Testosterone, Total, LC-MS-MS: 23 ng/dL (ref 2–45)

## 2023-12-04 LAB — ESTRADIOL: Estradiol: 233 pg/mL

## 2023-12-05 ENCOUNTER — Other Ambulatory Visit: Payer: Self-pay | Admitting: Radiology

## 2023-12-05 ENCOUNTER — Ambulatory Visit: Payer: Self-pay | Admitting: Radiology

## 2023-12-05 DIAGNOSIS — R6882 Decreased libido: Secondary | ICD-10-CM

## 2023-12-05 MED ORDER — TESTOSTERONE 20.25 MG/ACT (1.62%) TD GEL: TRANSDERMAL | 0 refills | Status: AC

## 2024-01-12 NOTE — Telephone Encounter (Signed)
 I recommend OV to discuss both concerns ( see other message sent from patient re: leakage). She has also seen EB in the past if she has any openings this week to be seen sooner.

## 2024-01-13 NOTE — Telephone Encounter (Signed)
 Patient is schedule for 10am 01/14/24 with EB

## 2024-01-14 ENCOUNTER — Encounter: Payer: Self-pay | Admitting: Obstetrics and Gynecology

## 2024-01-14 ENCOUNTER — Ambulatory Visit: Admitting: Obstetrics and Gynecology

## 2024-01-14 VITALS — BP 122/78 | HR 63 | Wt 153.2 lb

## 2024-01-14 DIAGNOSIS — R350 Frequency of micturition: Secondary | ICD-10-CM | POA: Diagnosis not present

## 2024-01-14 DIAGNOSIS — E66811 Obesity, class 1: Secondary | ICD-10-CM | POA: Diagnosis not present

## 2024-01-14 DIAGNOSIS — Z1211 Encounter for screening for malignant neoplasm of colon: Secondary | ICD-10-CM

## 2024-01-14 DIAGNOSIS — E038 Other specified hypothyroidism: Secondary | ICD-10-CM

## 2024-01-14 DIAGNOSIS — N951 Menopausal and female climacteric states: Secondary | ICD-10-CM | POA: Diagnosis not present

## 2024-01-14 DIAGNOSIS — N393 Stress incontinence (female) (male): Secondary | ICD-10-CM | POA: Diagnosis not present

## 2024-01-14 DIAGNOSIS — Z1231 Encounter for screening mammogram for malignant neoplasm of breast: Secondary | ICD-10-CM

## 2024-01-14 DIAGNOSIS — E2839 Other primary ovarian failure: Secondary | ICD-10-CM

## 2024-01-14 LAB — URINALYSIS
Bilirubin Urine: NEGATIVE
Glucose, UA: NEGATIVE
Hgb urine dipstick: NEGATIVE
Leukocytes,Ua: NEGATIVE
Nitrite: NEGATIVE
Protein, ur: NEGATIVE
Specific Gravity, Urine: 1.028 (ref 1.001–1.035)
pH: 5.5 (ref 5.0–8.0)

## 2024-01-14 MED ORDER — IMVEXXY MAINTENANCE PACK 10 MCG VA INST: 1.0000 | VAGINAL_INSERT | VAGINAL | 12 refills | Status: DC

## 2024-01-14 MED ORDER — TIRZEPATIDE-WEIGHT MANAGEMENT 2.5 MG/0.5ML ~~LOC~~ SOLN: 2.5000 mg | SUBCUTANEOUS | 1 refills | Status: AC

## 2024-01-14 NOTE — Patient Instructions (Addendum)
 Perimenopause/Menopause suggestions   You should be getting 1,200 mg of calcium a day between your diet and supplements and at least 3000 IU a day of Vit D. You should exercise regularly with weight bearing exercises. I would recommend a bone density q 2 years.  We loose 5% per year in this time period of our bone density, due to the loss of estrogen. Magnesium at bedtime for our sleep and bones (follow recommended dose on bottle)  Please read The New Menopause my Dr. [name] and Estrogen Matters: both are on [employer]  Try to avoid caffeine and alcohol in this period, as they can make symptoms worse  Continue annual mammograms, unless told sooner  Please reach out with any questions Katie Coffey  Locations you can schedule your bone scan are:  The [facility] bone scan location scheduling line is [phone]  [facility] is [phone]  [facility] radiology [phone]   [facility] [phone]  [facility] Katie Coffey: [phone]    Another option if they are behind is [facility] and their number is [phone].  I can send the referral if you want to go there.    Please let me know if you have any trouble scheduling. This is important for management of osteoporosis or osteopenia.   I can sit down with you after the scan to discuss results and treatment.  Dr. Carpen   Report monthly weights on the zepbound and any concerns.  Dose can increase each month to achieve ideal body weight.

## 2024-01-14 NOTE — Progress Notes (Signed)
   Acute Office Visit  Subjective:    Patient ID: Katie Coffey, female    DOB: 03-23-1970, 53 y.o.   MRN: [id number]   HPI 53 y.o. presents today for Consult (Consult for wt gain and leakage. Leakage has been going on for about 2 weeks ) . Decreased libido: testosterone  gel has helped Few hot flashes Weight gain is very bothersome. Has gained 10 pounds in past 6 months with diet and exercise. Has seen Katie Coffey recently and increased protein and exercise and feels defeated. She would be interested in starting zep bound and understands she still has to eat right and exercise. H/o DVT on long flight and was on ocp's at that time. No other episode. Hypothyroidism: needs repeat labs Colonoscopy: has scheduled for This coming April SUI past few weeks and has to wear a pad. To get UA/UC   Patient's last menstrual period was 02/22/2023 (exact date).    Review of Systems     Objective:    OBGyn Exam  BP 122/78   Pulse 63   Wt 153 lb 3.2 oz (69.5 kg)   LMP 02/22/2023 (Exact Date)   SpO2 97%   BMI 28.95 kg/m  Wt Readings from Last 3 Encounters:  01/14/24 153 lb 3.2 oz (69.5 kg)  11/28/23 148 lb (67.1 kg)  07/09/23 143 lb (64.9 kg)         Assessment & Plan:  SUI Weight gain in perimenopause, despite diet and exercise RLH Hypothyroidism  Referral to pelvic PT and urogyn. UA/UC placed. To begin vaginal estrogen and is safe with prior h/o DVT Labs today: see orders RTC in one month for a weight check. Zepbound sent to [facility]. Counseled on the medication and encouraged continued diet and exercise  4. RTC for annual exams and sooner with any concerns 30 minutes spent on reviewing records, imaging,  and one on one patient time and counseling patient and documentation Dr. Glennon Almarie Katie Coffey

## 2024-01-15 ENCOUNTER — Ambulatory Visit: Payer: Self-pay | Admitting: Obstetrics and Gynecology

## 2024-01-15 LAB — URINE CULTURE
MICRO NUMBER:: 17194306
Result:: NO GROWTH
SPECIMEN QUALITY:: ADEQUATE

## 2024-01-15 LAB — VITAMIN D 25 HYDROXY (VIT D DEFICIENCY, FRACTURES): Vit D, 25-Hydroxy: 48 ng/mL (ref 30–100)

## 2024-01-15 LAB — HEMOGLOBIN A1C
Hgb A1c MFr Bld: 5.3 % (ref <5.7)
Mean Plasma Glucose: 105 mg/dL
eAG (mmol/L): 5.8 mmol/L

## 2024-01-15 LAB — LIPID PANEL
Cholesterol: 214 mg/dL — ABNORMAL HIGH (ref <200)
HDL: 67 mg/dL (ref >=50)
LDL Cholesterol (Calc): 125 mg/dL — ABNORMAL HIGH
Non-HDL Cholesterol (Calc): 147 mg/dL — ABNORMAL HIGH (ref <130)
Total CHOL/HDL Ratio: 3.2 (calc) (ref <5.0)
Triglycerides: 110 mg/dL (ref <150)

## 2024-01-15 LAB — ESTRADIOL: Estradiol: <30 pg/mL

## 2024-01-15 LAB — FOLLICLE STIMULATING HORMONE: FSH: 54.1 m[IU]/mL

## 2024-01-15 LAB — T4, FREE: Free T4: 1.3 ng/dL (ref 0.8–1.8)

## 2024-01-15 LAB — TSH: TSH: 1.54 m[IU]/L

## 2024-02-09 DIAGNOSIS — E039 Hypothyroidism, unspecified: Secondary | ICD-10-CM | POA: Diagnosis not present

## 2024-02-09 DIAGNOSIS — I10 Essential (primary) hypertension: Secondary | ICD-10-CM | POA: Diagnosis not present

## 2024-02-09 DIAGNOSIS — F419 Anxiety disorder, unspecified: Secondary | ICD-10-CM | POA: Diagnosis not present

## 2024-02-09 DIAGNOSIS — N951 Menopausal and female climacteric states: Secondary | ICD-10-CM | POA: Diagnosis not present

## 2024-02-19 ENCOUNTER — Encounter: Payer: Self-pay | Admitting: Obstetrics and Gynecology

## 2024-02-19 ENCOUNTER — Ambulatory Visit: Admitting: Obstetrics and Gynecology

## 2024-02-19 VITALS — BP 110/72 | HR 70 | Resp 16 | Wt 145.0 lb

## 2024-02-19 DIAGNOSIS — Z713 Dietary counseling and surveillance: Secondary | ICD-10-CM | POA: Diagnosis not present

## 2024-02-19 MED ORDER — ONDANSETRON 4 MG PO TBDP: 4.0000 mg | ORAL_TABLET | Freq: Three times a day (TID) | ORAL | 3 refills | Status: AC | PRN

## 2024-02-19 NOTE — Progress Notes (Signed)
° °  Acute Office Visit  Subjective:    Patient ID: Katie Coffey, female    DOB: 16-Oct-1970, 53 y.o.   MRN: [id number]   HPI 53 y.o. presents today for Follow-up (5 week follow up of zepbound . Wt was 153lb on 01-14-24, 145lb on 02-19-24) .imvexxy  was expensive and came out after insertion. Would like to do the estrogen cream. Did 2.5mg  once and reports nausea and fatigue the next day and could not eat. Wants to still work with the medication and try a lower dose.   Patient's last menstrual period was 02/22/2023.    Review of Systems     Objective:    OBGyn Exam  BP 110/72   Pulse 70   Resp 16   Wt 145 lb (65.8 kg)   LMP 02/22/2023   BMI 27.40 kg/m  Wt Readings from Last 3 Encounters:  02/19/24 145 lb (65.8 kg)  01/14/24 153 lb 3.2 oz (69.5 kg)  11/28/23 148 lb (67.1 kg)     Assessment & Plan:  Weight loss management Atrophic vaginitis  Self pay with vial and to reduce to 1 mg every other week.  Patient to notify us  of weight and if tolerating lower dose.  Encouraged diet and exercise with high protein diet and increased water  intake and daily MVI. To stop imvexxy  and begin vaginal estrogen 4 times a week. Counseled on how to use.  20 minutes spent on reviewing records, imaging,  and one on one patient time and counseling patient and documentation Dr. Glennon Almarie [name]

## 2024-03-01 ENCOUNTER — Ambulatory Visit: Admitting: Radiology

## 2024-03-02 ENCOUNTER — Other Ambulatory Visit: Payer: Self-pay | Admitting: Obstetrics and Gynecology

## 2024-03-02 ENCOUNTER — Encounter: Payer: Self-pay | Admitting: Obstetrics and Gynecology

## 2024-03-02 MED ORDER — TIRZEPATIDE-WEIGHT MANAGEMENT 2.5 MG/0.5ML ~~LOC~~ SOLN: 2.5000 mg | SUBCUTANEOUS | 4 refills | Status: AC

## 2024-03-18 ENCOUNTER — Ambulatory Visit: Admitting: Allergy

## 2024-03-19 NOTE — Progress Notes (Unsigned)
 " OUTPATIENT PHYSICAL THERAPY FEMALE PELVIC EVALUATION   Patient Name: Katie Coffey MRN: [id number] DOB:1970/10/03, 54 y.o., female Today's Date: 03/19/2024  END OF SESSION:   Past Medical History:  Diagnosis Date   Adenomyosis    Dysmenorrhea    Fibrocystic breast    History of DVT (deep vein thrombosis) 10/11/2017   left gastrocemius in setting OCP and was on a 10 hour flight),  pt stated completed xarelto ,  (03-10-2023 never had a clot prior to or since)   Hypertension    Hypothyroidism    Hypothyroidism    IDA (iron deficiency anemia)    Insomnia    Menorrhagia    OSA (obstructive sleep apnea) 09/2021   followed by dr r. turner;  (03-10-2023 pt stated no cpap)  HST 09-28-2021 in epic , mild OSA ,  AHI of 8.9/h and lowest O2 saturations 87% and RDI of 12/hr   PSVT (paroxysmal supraventricular tachycardia)    cardiology--- dr t . shlomo;   event monitor 10-16-2021  2 episodes SVT longes 17 beats,  NSR w/ PVCs / PACs;  normal echo ef 55-60%, mild MR;   CTA/ CTC w/ calcium score zero and no evidence CAD   Uterine fibroid    Wears contact lenses    Past Surgical History:  Procedure Laterality Date   CESAREAN SECTION  03/30/1999   @[facility]  by dr r. neal   CESAREAN SECTION WITH BILATERAL TUBAL LIGATION Bilateral 07/13/2003   @[facility]  by dr r. neal   CHOLECYSTECTOMY N/A 09/16/2014   Procedure: LAPAROSCOPIC CHOLECYSTECTOMY WITH INTRAOPERATIVE CHOLANGIOGRAM;  Surgeon: Debby Shipper, MD;  Location: [facility];  Service: General;  Laterality: N/A;   CYSTOSCOPY N/A 03/21/2023   Procedure: CYSTOSCOPY;  Surgeon: Glennon Almarie POUR, MD;  Location: [facility];  Service: Gynecology;  Laterality: N/A;   DILATION AND CURETTAGE OF UTERUS     x2  for missed ab  in 1990s   OVARIAN CYST SURGERY  01/1990   laparotomy   REPEAT CESAREAN SECTION  01/18/2001   @[facility]  by dr r. neal   ROBOTIC ASSISTED LAPAROSCOPIC HYSTERECTOMY AND SALPINGECTOMY Bilateral 03/21/2023   Procedure: XI  ROBOTIC ASSISTED LAPAROSCOPIC HYSTERECTOMY AND SALPINGECTOMY;  Surgeon: Glennon Almarie POUR, MD;  Location: [facility];  Service: Gynecology;  Laterality: Bilateral;   Patient Active Problem List   Diagnosis Date Noted   Hypertension 07/03/2022   OSA (obstructive sleep apnea) 11/01/2021   Atypical squamous cells of undetermined significance on cytologic smear of cervix (ASC-US ) 06/27/2021   Excessive and frequent menstruation 06/27/2021   Family history of malignant neoplasm of digestive organs 06/27/2021   History of anemia 06/27/2021   Hypoglycemia 06/27/2021   Insomnia 06/27/2021   Restless legs syndrome 06/27/2021   Tension-type headache 06/27/2021   DVT (deep venous thrombosis) (HCC) 06/27/2021   Persistent microscopic hematuria 03/01/2020   Disorder of kidney and ureter 07/06/2014   Hypothyroid     PCP: Teresa Channel, MD  REFERRING PROVIDER: Glennon Almarie POUR, MD   REFERRING DIAG: R35.0 (ICD-10-CM) - Urinary frequency   THERAPY DIAG:  No diagnosis found.  Rationale for Evaluation and Treatment: Rehabilitation  ONSET DATE: ***  SUBJECTIVE:  SUBJECTIVE STATEMENT: *** Fluid intake:   FUNCTIONAL LIMITATIONS: ***  PERTINENT HISTORY:  Medications for current condition: *** Surgeries: Cesarean x 3; cholecystectomy; ovarian cyst surgery; Robotic laparoscopic hysterctomy and salpingectomy 03/21/23 Other: *** Sexual abuse: {Yes/No:304960894}  PAIN:  Are you having pain? {yes/no:20286} NPRS scale: ***/10 Pain location: {pelvic pain location:27098}  Pain type: {type:313116} Pain description: {PAIN DESCRIPTION:21022940}   Aggravating factors: *** Relieving factors: ***  PRECAUTIONS: None  RED FLAGS: {PT Red Flags:29287}   WEIGHT BEARING RESTRICTIONS:  No  FALLS:  Has patient fallen in last 6 months? {fallsyesno:27318}  OCCUPATION: ***  ACTIVITY LEVEL : ***  PLOF: {PLOF:24004}  PATIENT GOALS: ***   BOWEL MOVEMENT: Pain with bowel movement: {yes/no:20286} Type of bowel movement:{PT BM type:27100} Fully empty rectum: {No/Yes:304960894} Leakage: {Yes/No:304960894}                                                  Caused by: *** Bowel urgency: *** Pads: {Yes/No:304960894} Fiber supplement/laxative {YES/NO AS:20300}  URINATION: Pain with urination: {yes/no:20286} Fully empty bladder: {Yes/No:304960894}***                                         Post-void dribble: {YES/NO AS:20300} Stream: {PT urination:27102} Urgency: {YES/NO AS:20300} Frequency:during the day ***                                                        Nocturia: {Yes/No:304960894}***   Leakage: {PT leakage:27103} Pads/briefs: {Yes/No:304960894}  INTERCOURSE:  Ability to have vaginal penetration {YES/NO:21197} Pain with intercourse: {pain with intercourse PA:27099} Dryness: {YES/NO AS:20300} Climax: *** Marinoff Scale: ***/3 Lubricant:  PREGNANCY: Vaginal deliveries *** Tearing {Yes***/No:304960894} Episiotomy {YES/NO AS:20300} C-section deliveries *** Currently pregnant {Yes***/No:304960894}  PROLAPSE: {PT prolapse:27101}   OBJECTIVE:  Note: Objective measures were completed at Evaluation unless otherwise noted.  DIAGNOSTIC FINDINGS:  Post-void residual: Voiding Cystourethrogram (VCUG):  Ultrasound: ***  PATIENT SURVEYS:  {rehab surveys:24030}  PFIQ-7: *** UIQ-7 *** CRAIG -7 *** POPIQ-7 *** Female Sexual Function Index (FSFI) Questionnaire ***  COGNITION: Overall cognitive status: {cognition:24006}     SENSATION: Light touch: {intact/deficits:24005}  LUMBAR SPECIAL TESTS:  {lumbar special test:25242}  FUNCTIONAL TESTS:  {Functional tests:24029} Single leg stance:  Rt:  Lt: Sit-up test: Squat: Bed  mobility:  GAIT: Assistive device utilized: {Assistive devices:23999} Comments: ***  POSTURE: {posture:25561}   LUMBARAROM/PROM:  A/PROM A/PROM  Eval (% available)  Flexion   Extension   Right lateral flexion   Left lateral flexion   Right rotation   Left rotation    (Blank rows = not tested)  LOWER EXTREMITY ROM:  {AROM/PROM:27142} ROM Right eval Left eval  Hip flexion    Hip extension    Hip abduction    Hip adduction    Hip internal rotation    Hip external rotation    Knee flexion    Knee extension    Ankle dorsiflexion    Ankle plantarflexion    Ankle inversion    Ankle eversion     (Blank rows = not tested)  LOWER EXTREMITY MMT:  MMT Right eval Left eval  Hip flexion    Hip extension    Hip abduction    Hip adduction    Hip internal rotation    Hip external rotation    Knee flexion    Knee extension    Ankle dorsiflexion    Ankle plantarflexion    Ankle inversion    Ankle eversion     (Blank rows = not tested) PALPATION:  General: ***  Pelvic Alignment: ***  Abdominal: ***  Diastasis: {Yes/No:304960894}*** Distortion: {YES/NO AS:20300}  Breathing: *** Scar tissue: {Yes/No:304960894}*** Active Straight Leg Raise: ***                External Perineal Exam: ***                             Internal Pelvic Floor: ***  Patient confirms identification and approves PT to assess internal pelvic floor and treatment {yes/no:20286} All internal or external pelvic floor assessments and/or treatments are completed with proper hand hygiene and gloves hands. If needed gloves are changed with hand hygiene during patient care time.  PELVIC MMT:   MMT eval  Vaginal   Internal Anal Sphincter   External Anal Sphincter   Puborectalis   (Blank rows = not tested)        TONE: ***  PROLAPSE: ***  TODAY'S TREATMENT:                                                                                                                               DATE: ***  EVAL ***   PATIENT EDUCATION:  Education details: *** Person educated: {Person educated:25204} Education method: {Education Method:25205} Education comprehension: {Education Comprehension:25206}  HOME EXERCISE PROGRAM: ***  ASSESSMENT:  CLINICAL IMPRESSION: Patient is a *** y.o. *** who was seen today for physical therapy evaluation and treatment for ***.   OBJECTIVE IMPAIRMENTS: {opptimpairments:25111}.   ACTIVITY LIMITATIONS: {activitylimitations:27494}  PARTICIPATION LIMITATIONS: {participationrestrictions:25113}  PERSONAL FACTORS: {Personal factors:25162} are also affecting patient's functional outcome.   REHAB POTENTIAL: {rehabpotential:25112}  CLINICAL DECISION MAKING: {clinical decision making:25114}  EVALUATION COMPLEXITY: {Evaluation complexity:25115}   GOALS: Goals reviewed with patient? {yes/no:20286}  SHORT TERM GOALS: Target date: ***  *** Baseline: Goal status: INITIAL  2.  *** Baseline:  Goal status: INITIAL  3.  *** Baseline:  Goal status: INITIAL  4.  *** Baseline:  Goal status: INITIAL  5.  *** Baseline:  Goal status: INITIAL  6.  *** Baseline:  Goal status: INITIAL  LONG TERM GOALS: Target date: ***  *** Baseline:  Goal status: INITIAL  2.  *** Baseline:  Goal status: INITIAL  3.  *** Baseline:  Goal status: INITIAL  4.  *** Baseline:  Goal status: INITIAL  5.  *** Baseline:  Goal status: INITIAL  6.  *** Baseline:  Goal status: INITIAL  PLAN:  PT FREQUENCY: {rehab frequency:25116}  PT DURATION: {rehab duration:25117}  PLANNED INTERVENTIONS: {rehab planned interventions:25118::97110-Therapeutic  [phone]- Therapeutic [phone]- Neuromuscular re-education,[phone]- Self Rjmz,[phone]- Manual therapy,Patient/Family education}  PLAN FOR NEXT SESSION: ***   Takoda Janowiak, PT 03/19/2024, 12:07 PM  "

## 2024-03-22 ENCOUNTER — Ambulatory Visit: Admitting: Physical Therapy

## 2024-05-07 ENCOUNTER — Ambulatory Visit: Admitting: Physical Therapy
